# Patient Record
Sex: Male | Born: 1990 | Race: Black or African American | Hispanic: No | Marital: Single | Smoking: Current every day smoker
Health system: Southern US, Community
[De-identification: ages and names within clinical notes are randomized; demographics above are authoritative.]

## PROBLEM LIST (undated history)

## (undated) HISTORY — PX: EYE SURGERY: SHX253

---
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
## 2012-01-08 ENCOUNTER — Ambulatory Visit: Payer: Self-pay | Admitting: Family Medicine

## 2012-01-17 ENCOUNTER — Ambulatory Visit: Payer: Self-pay | Admitting: Family Medicine

## 2012-02-12 ENCOUNTER — Ambulatory Visit: Payer: Self-pay | Admitting: Family Medicine

## 2012-05-13 ENCOUNTER — Emergency Department (INDEPENDENT_AMBULATORY_CARE_PROVIDER_SITE_OTHER)
Admission: EM | Admit: 2012-05-13 | Discharge: 2012-05-13 | Disposition: A | Discharge status: Home / Self Care | Payer: Managed Care, Other (non HMO) | Source: Home / Self Care | Attending: Family Medicine | Admitting: Family Medicine

## 2012-05-13 ENCOUNTER — Encounter (HOSPITAL_COMMUNITY): Payer: Self-pay | Admitting: *Deleted

## 2012-05-13 DIAGNOSIS — L738 Other specified follicular disorders: Secondary | ICD-10-CM

## 2012-05-13 MED ORDER — DOXYCYCLINE HYCLATE 100 MG PO CAPS: 100.0000 mg | ORAL_CAPSULE | Freq: Two times a day (BID) | ORAL | Status: DC

## 2012-05-13 NOTE — ED Provider Notes (Addendum)
History     CSN: [id number]  Arrival date & time 05/13/12  0909   First MD Initiated Contact with Patient 05/13/12 1006      Chief Complaint  Patient presents with  . Rash    (Consider location/radiation/quality/duration/timing/severity/associated sxs/prior treatment) Patient is a 21 y.o. male presenting with rash. The history is provided by the patient.  Rash  This is a new problem. The current episode started more than 1 week ago. The problem has been gradually worsening. The problem is associated with an unknown factor. There has been no fever. The rash is present on the trunk, face and neck.    History reviewed. No pertinent past medical history.  History reviewed. No pertinent past surgical history.  No family history on file.  History  Substance Use Topics  . Smoking status: Not on file  . Smokeless tobacco: Not on file  . Alcohol Use: Yes     Comment: occasonally      Review of Systems  Constitutional: Negative.   Skin: Positive for rash.    Allergies  Review of patient's allergies indicates no known allergies.  Home Medications   Current Outpatient Rx  Name  Route  Sig  Dispense  Refill  . DOXYCYCLINE HYCLATE 100 MG PO CAPS   Oral   Take 1 capsule (100 mg total) by mouth 2 (two) times daily.   20 capsule   1     BP 126/77  Pulse 60  Temp 98.4 F (36.9 C) (Oral)  Resp 20  SpO2 98%  Physical Exam  Nursing note and vitals reviewed. Constitutional: He is oriented to person, place, and time. He appears well-developed and well-nourished. No distress.  HENT:  Head: Normocephalic.  Neck: Normal range of motion. Neck supple.  Lymphadenopathy:    He has no cervical adenopathy.  Neurological: He is alert and oriented to person, place, and time.  Skin: Skin is warm and dry. Rash noted.       (pustular follicular rash on axilla , upper back and neck and facial hair distribution.    ED Course  Procedures (including critical care time)  Labs  Reviewed - No data to display No results found.   1. Bacterial folliculitis       MDM          Linna Hoff, MD 05/13/12 1029  Linna Hoff, MD 05/13/12 1745

## 2012-05-13 NOTE — ED Notes (Signed)
Pt  Has  A  Rash  -  He  Reports    He  Has  Had  The  Rash  For  About 1  Week   -  He  Reports  Son  Had  A  Rash  And  Was  tx  For   ringworm   He  denys  Any  Known    Causative   Agent       He  Is  In no  Acute  Distress

## 2013-02-25 ENCOUNTER — Emergency Department (HOSPITAL_COMMUNITY)
Admission: EM | Admit: 2013-02-25 | Discharge: 2013-02-25 | Disposition: A | Discharge status: Home / Self Care | Payer: Managed Care, Other (non HMO) | Attending: Emergency Medicine | Admitting: Emergency Medicine

## 2013-02-25 ENCOUNTER — Emergency Department (HOSPITAL_COMMUNITY): Payer: Managed Care, Other (non HMO)

## 2013-02-25 ENCOUNTER — Encounter (HOSPITAL_COMMUNITY): Payer: Self-pay

## 2013-02-25 DIAGNOSIS — Z79899 Other long term (current) drug therapy: Secondary | ICD-10-CM | POA: Insufficient documentation

## 2013-02-25 DIAGNOSIS — S060X9A Concussion with loss of consciousness of unspecified duration, initial encounter: Secondary | ICD-10-CM | POA: Insufficient documentation

## 2013-02-25 DIAGNOSIS — S0181XA Laceration without foreign body of other part of head, initial encounter: Secondary | ICD-10-CM

## 2013-02-25 DIAGNOSIS — S0180XA Unspecified open wound of other part of head, initial encounter: Secondary | ICD-10-CM | POA: Insufficient documentation

## 2013-02-25 DIAGNOSIS — R112 Nausea with vomiting, unspecified: Secondary | ICD-10-CM | POA: Insufficient documentation

## 2013-02-25 DIAGNOSIS — Z87891 Personal history of nicotine dependence: Secondary | ICD-10-CM | POA: Insufficient documentation

## 2013-02-25 MED ORDER — ONDANSETRON 4 MG PO TBDP
4.0000 mg | ORAL_TABLET | Freq: Once | ORAL | Status: AC
  Administered 2013-02-25: 4 mg via ORAL
  Filled 2013-02-25: qty 1

## 2013-02-25 MED ORDER — HYDROCODONE-ACETAMINOPHEN 5-325 MG PO TABS: 1.0000 | ORAL_TABLET | Freq: Four times a day (QID) | ORAL | Status: DC | PRN

## 2013-02-25 MED ORDER — HYDROCODONE-ACETAMINOPHEN 5-325 MG PO TABS
1.0000 | ORAL_TABLET | ORAL | Status: AC
  Administered 2013-02-25: 1 via ORAL
  Filled 2013-02-25: qty 1

## 2013-02-25 MED ORDER — ONDANSETRON 8 MG PO TBDP: 8.0000 mg | ORAL_TABLET | Freq: Three times a day (TID) | ORAL | Status: DC | PRN
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
## 2013-02-25 NOTE — ED Notes (Signed)
Suture cart at bedside 
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
## 2013-02-25 NOTE — ED Provider Notes (Signed)
LACERATION REPAIR Performed by: Lottie Mussel Authorized by: Jaynie Crumble A Consent: Verbal consent obtained. Risks and benefits: risks, benefits and alternatives were discussed Consent given by: patient Patient identity confirmed: provided demographic data Prepped and Draped in normal sterile fashion Wound explored  Laceration Location: right eye  Laceration Length: 2cm  No Foreign Bodies seen or palpated  Anesthesia: local infiltration  Local anesthetic: lidocaine 1% w epinephrine  Anesthetic total: 2 ml  Irrigation method: syringe Amount of cleaning: standard  Skin closure: prolene 6.0  Number of sutures: 5  Technique: simple interrupted  Patient tolerance: Patient tolerated the procedure well with no immediate complications.   Lottie Mussel, PA-C 02/25/13 1958

## 2015-03-28 ENCOUNTER — Ambulatory Visit (INDEPENDENT_AMBULATORY_CARE_PROVIDER_SITE_OTHER): Payer: Managed Care, Other (non HMO) | Admitting: Family Medicine

## 2015-03-28 VITALS — BP 117/75 | HR 65 | Temp 98.5°F | Resp 18 | Ht 74.0 in | Wt 157.6 lb

## 2015-03-28 DIAGNOSIS — G510 Bell's palsy: Secondary | ICD-10-CM | POA: Diagnosis not present

## 2015-03-28 DIAGNOSIS — Z7952 Long term (current) use of systemic steroids: Secondary | ICD-10-CM

## 2015-03-28 LAB — GLUCOSE, POCT (MANUAL RESULT ENTRY): POC GLUCOSE: 97 mg/dL (ref 70–99)

## 2015-03-28 MED ORDER — PREDNISONE 20 MG PO TABS: ORAL_TABLET | ORAL | Status: DC

## 2015-03-28 MED ORDER — VALACYCLOVIR HCL 1 G PO TABS: 1000.0000 mg | ORAL_TABLET | Freq: Two times a day (BID) | ORAL | Status: AC

## 2015-03-28 NOTE — Patient Instructions (Addendum)
Take prednisone 3 tabs a day for 7 days. Then taper -- 2 tabs for 3 days, 1 tab for 3 days, then stop. Take valtrex twice a day for 7 days. Buy artificial tears and use frequently during the day. Wear eye patch at night. Return in 3-4 weeks for f/u  Bell Palsy Bell palsy is a condition in which the muscles on one side of the face become paralyzed. This often causes one side of the face to droop. It is a common condition and most people recover completely. RISK FACTORS Risk factors for Bell palsy include:  Pregnancy.  Diabetes.  An infection by a virus, such as infections that cause cold sores. CAUSES  Bell palsy is caused by damage to or inflammation of a nerve in your face. It is unclear why this happens, but an infection by a virus may lead to it. Most of the time the reason it happens is unknown. SIGNS AND SYMPTOMS  Symptoms can range from mild to severe and can take place over a number of hours. Symptoms may include:  Being unable to:  Raise one or both eyebrows.  Close one or both eyes.  Feel parts of your face (facial numbness).  Drooping of the eyelid and corner of the mouth.  Weakness in the face.  Paralysis of half your face.  Loss of taste.  Sensitivity to loud noises.  Difficulty chewing.  Tearing up of the affected eye.  Dryness in the affected eye.  Drooling.  Pain behind one ear. DIAGNOSIS  Diagnosis of Bell palsy may include:  A medical history and physical exam.  An MRI.  A CT scan.  Electromyography (EMG). This is a test that checks how your nerves are working. TREATMENT  Treatment may include antiviral medicine to help shorten the length of the condition. Sometimes treatment is not needed and the symptoms go away on their own. HOME CARE INSTRUCTIONS   Take medicines only as directed by your health care provider.  Do facial massages and exercises as directed by your health care provider.  If your eye is affected:  Use moisturizing  eye drops to prevent drying of your eye as directed by your health care provider.  Protect your eye as directed by your health care provider. SEEK MEDICAL CARE IF:  Your symptoms do not get better or get worse.  You are drooling.  Your eye is red, irritated, or hurts. SEEK IMMEDIATE MEDICAL CARE IF:   Another part of your body feels weak or numb.  You have difficulty swallowing.  You have a fever along with symptoms of Bell palsy.  You develop neck pain. MAKE SURE YOU:   Understand these instructions.  Will watch your condition.  Will get help right away if you are not doing well or get worse.   This information is not intended to replace advice given to you by your health care provider. Make sure you discuss any questions you have with your health care provider.   Document Released: 05/28/2005 Document Revised: 02/16/2015 Document Reviewed: 09/04/2013 Elsevier Interactive Patient Education [employer]2016 Elsevier Inc.

## 2015-03-28 NOTE — Progress Notes (Signed)
[facility] [address], [place] [address] [phone]  Date:  03/28/2015   Name:  Logan Armstrong   DOB:  1991-03-29   MRN:  [id number]  PCP:  No primary care provider on file.    Chief Complaint: Has no control ove Lt side of face   History of Present Illness:  This is a 24 y.o. male who is presenting left sided face numbness x 3 days. He states at first his face just felt numb. Yesterday he became unable to move the muscles on the left side of his face. When he woke this morning he was still unable to move the left side of his face and decided to come in. He has left sided tongue numbness as well. He denies pain, decreased hearing, change in vision, fevers, numbness or weakness elsewhere. This has never happened before.  Review of Systems:  Review of Systems See HPI  There are no active problems to display for this patient.   Prior to Admission medications   Not on File    No Known Allergies  Past Surgical History  Procedure Laterality Date  . Eye surgery      Social History  Substance Use Topics  . Smoking status: Former Games developermoker  . Smokeless tobacco: Never Used  . Alcohol Use: Yes     Comment: occasonally    Family History  Problem Relation Age of Onset  . Hyperlipidemia Mother     Medication list has been reviewed and updated.  Physical Examination:  Physical Exam  Constitutional: He is oriented to person, place, and time. He appears well-developed and well-nourished. No distress.  HENT:  Head: Normocephalic and atraumatic.  Right Ear: Hearing, tympanic membrane, external ear and ear canal normal.  Left Ear: Hearing, tympanic membrane, external ear and ear canal normal.  Nose: Nose normal.  Mouth/Throat: Uvula is midline, oropharynx is clear and moist and mucous membranes are normal.  Eyes: Conjunctivae, EOM and lids are normal. Pupils are equal, round, and reactive to light. Right eye exhibits no discharge. Left eye  exhibits no discharge. No scleral icterus.  Cardiovascular: Normal rate, regular rhythm, normal heart sounds and normal pulses.   No murmur heard. Pulmonary/Chest: Effort normal and breath sounds normal. No respiratory distress. He has no wheezes. He has no rhonchi. He has no rales.  Musculoskeletal: Normal range of motion.  Lymphadenopathy:       Head (right side): No submental, no submandibular and no tonsillar adenopathy present.       Head (left side): No submental, no submandibular and no tonsillar adenopathy present.    He has no cervical adenopathy.  Neurological: He is alert and oriented to person, place, and time. He has normal reflexes. A cranial nerve deficit (CN VII) is present. No sensory deficit. Gait normal.  Left facial paralysis. Loss of forehead lines on left. Unable to lift left eyebrow or smile with left side of mouth. He is able to partially close left eyelid when right eyelid is closed. He is unable to close left eyelid when right eyelid is open.  Neuro exam otherwise normal. No sensory deficit.  Skin: Skin is warm, dry and intact. No lesion and no rash noted.  Psychiatric: He has a normal mood and affect. His speech is normal and behavior is normal. Thought content normal.   BP 117/75 mmHg  Pulse 65  Temp(Src) 98.5 F (36.9 C) (Oral)  Resp 18  Ht 6\' 2"  (1.88 m)  Wt  157 lb 9.6 oz (71.487 kg)  BMI 20.23 kg/m2  SpO2 98%  Results for orders placed or performed in visit on 03/28/15  POCT glucose (manual entry)  Result Value Ref Range   POC Glucose 97 70 - 99 mg/dl   Assessment and Plan:  1. Bell's palsy 2. On prednisone therapy Pt will take prednisone 60 mg QAM x 7 days, 40 mg x 3 days, 20 mg x 3 days and then stop. He will also take valtrex 1 gm BID x 7 days. He will apply artificial tears every 1-2 hours during the day and wear an eye patch at night. Return in 3-4 weeks for follow up. - predniSONE (DELTASONE) 20 MG tablet; Take 3 tabs po QAM x 7 days, then 2  tabs po QAM x 3 days, then 1 tab po QAM x 3 days, then stop.  Dispense: 31 tablet; Refill: 0 - valACYclovir (VALTREX) 1000 MG tablet; Take 1 tablet (1,000 mg total) by mouth 2 (two) times daily.  Dispense: 14 tablet; Refill: 0 - POCT glucose (manual entry)   [place] [employer]. Dyke Brackett, MHS [facility]  03/28/2015

## 2015-04-25 ENCOUNTER — Ambulatory Visit: Payer: Managed Care, Other (non HMO) | Admitting: Physician Assistant

## 2015-12-14 ENCOUNTER — Emergency Department (HOSPITAL_COMMUNITY)
Admission: EM | Admit: 2015-12-14 | Discharge: 2015-12-14 | Disposition: A | Discharge status: Home / Self Care | Payer: Managed Care, Other (non HMO) | Attending: Emergency Medicine | Admitting: Emergency Medicine

## 2015-12-14 ENCOUNTER — Emergency Department (HOSPITAL_COMMUNITY): Payer: Managed Care, Other (non HMO)

## 2015-12-14 ENCOUNTER — Encounter (HOSPITAL_COMMUNITY): Payer: Self-pay | Admitting: *Deleted

## 2015-12-14 DIAGNOSIS — S51811A Laceration without foreign body of right forearm, initial encounter: Secondary | ICD-10-CM | POA: Insufficient documentation

## 2015-12-14 DIAGNOSIS — S61511A Laceration without foreign body of right wrist, initial encounter: Secondary | ICD-10-CM | POA: Insufficient documentation

## 2015-12-14 DIAGNOSIS — T22291A Burn of second degree of multiple sites of right shoulder and upper limb, except wrist and hand, initial encounter: Secondary | ICD-10-CM | POA: Insufficient documentation

## 2015-12-14 DIAGNOSIS — Y9384 Activity, sleeping: Secondary | ICD-10-CM | POA: Insufficient documentation

## 2015-12-14 DIAGNOSIS — X021XXA Exposure to smoke in controlled fire in building or structure, initial encounter: Secondary | ICD-10-CM | POA: Diagnosis not present

## 2015-12-14 DIAGNOSIS — S46901A Unspecified injury of unspecified muscle, fascia and tendon at shoulder and upper arm level, right arm, initial encounter: Secondary | ICD-10-CM

## 2015-12-14 DIAGNOSIS — Y92009 Unspecified place in unspecified non-institutional (private) residence as the place of occurrence of the external cause: Secondary | ICD-10-CM | POA: Diagnosis not present

## 2015-12-14 DIAGNOSIS — T1490XA Injury, unspecified, initial encounter: Secondary | ICD-10-CM

## 2015-12-14 DIAGNOSIS — F17200 Nicotine dependence, unspecified, uncomplicated: Secondary | ICD-10-CM | POA: Insufficient documentation

## 2015-12-14 DIAGNOSIS — Y999 Unspecified external cause status: Secondary | ICD-10-CM | POA: Diagnosis not present

## 2015-12-14 DIAGNOSIS — S60511A Abrasion of right hand, initial encounter: Secondary | ICD-10-CM | POA: Insufficient documentation

## 2015-12-14 DIAGNOSIS — T07XXXA Unspecified multiple injuries, initial encounter: Secondary | ICD-10-CM

## 2015-12-14 DIAGNOSIS — T22091A Burn of unspecified degree of multiple sites of right shoulder and upper limb, except wrist and hand, initial encounter: Secondary | ICD-10-CM | POA: Diagnosis present

## 2015-12-14 DIAGNOSIS — T2220XA Burn of second degree of shoulder and upper limb, except wrist and hand, unspecified site, initial encounter: Secondary | ICD-10-CM

## 2015-12-14 LAB — CARBOXYHEMOGLOBIN
Carboxyhemoglobin: 7.9 % (ref 0.5–1.5)
Carboxyhemoglobin: 1.9 % — ABNORMAL HIGH (ref 0.5–1.5)
Methemoglobin: 0.9 % (ref 0.0–1.5)
Methemoglobin: 1.1 % (ref 0.0–1.5)
O2 SAT: 87.9 %
O2 SAT: 97.1 %
TOTAL HEMOGLOBIN: 13.3 g/dL — AB (ref 13.5–18.0)
TOTAL HEMOGLOBIN: 12.5 g/dL — AB (ref 13.5–18.0)

## 2015-12-14 LAB — CBC WITH DIFFERENTIAL/PLATELET
Basophils Absolute: 0 10*3/uL (ref 0.0–0.1)
Basophils Relative: 1 %
EOS ABS: 0.4 10*3/uL (ref 0.0–0.7)
EOS PCT: 5 %
HCT: 39.8 % (ref 39.0–52.0)
Hemoglobin: 13.1 g/dL (ref 13.0–17.0)
LYMPHS ABS: 3 10*3/uL (ref 0.7–4.0)
LYMPHS PCT: 34 %
MCH: 28.5 pg (ref 26.0–34.0)
MCHC: 32.9 g/dL (ref 30.0–36.0)
MCV: 86.7 fL (ref 78.0–100.0)
MONO ABS: 0.5 10*3/uL (ref 0.1–1.0)
MONOS PCT: 6 %
Neutro Abs: 4.9 10*3/uL (ref 1.7–7.7)
Neutrophils Relative %: 54 %
PLATELETS: 290 10*3/uL (ref 150–400)
RBC: 4.59 MIL/uL (ref 4.22–5.81)
RDW: 13.1 % (ref 11.5–15.5)
WBC: 8.9 10*3/uL (ref 4.0–10.5)

## 2015-12-14 LAB — I-STAT VENOUS BLOOD GAS, ED
ACID-BASE DEFICIT: 1 mmol/L (ref 0.0–2.0)
Bicarbonate: 23.2 mEq/L (ref 20.0–24.0)
O2 SAT: 88 %
PCO2 VEN: 36.6 mmHg — AB (ref 45.0–50.0)
PH VEN: 7.411 — AB (ref 7.250–7.300)
Patient temperature: 98.6
TCO2: 24 mmol/L (ref 0–100)
pO2, Ven: 54 mmHg — ABNORMAL HIGH (ref 31.0–45.0)

## 2015-12-14 LAB — COMPREHENSIVE METABOLIC PANEL
ALT: 21 U/L (ref 17–63)
ANION GAP: 12 (ref 5–15)
AST: 25 U/L (ref 15–41)
Albumin: 4.2 g/dL (ref 3.5–5.0)
Alkaline Phosphatase: 66 U/L (ref 38–126)
BUN: 17 mg/dL (ref 6–20)
CALCIUM: 9.2 mg/dL (ref 8.9–10.3)
CHLORIDE: 106 mmol/L (ref 101–111)
CO2: 21 mmol/L — ABNORMAL LOW (ref 22–32)
CREATININE: 1.19 mg/dL (ref 0.61–1.24)
Glucose, Bld: 95 mg/dL (ref 65–99)
Potassium: 4.1 mmol/L (ref 3.5–5.1)
SODIUM: 139 mmol/L (ref 135–145)
Total Bilirubin: 0.6 mg/dL (ref 0.3–1.2)
Total Protein: 7.4 g/dL (ref 6.5–8.1)

## 2015-12-14 LAB — ETHANOL: ALCOHOL ETHYL (B): 140 mg/dL — AB (ref <5)

## 2015-12-14 MED ORDER — TETANUS-DIPHTH-ACELL PERTUSSIS 5-2.5-18.5 LF-MCG/0.5 IM SUSP
0.5000 mL | Freq: Once | INTRAMUSCULAR | Status: AC
  Administered 2015-12-14: 0.5 mL via INTRAMUSCULAR
  Filled 2015-12-14: qty 0.5

## 2015-12-14 MED ORDER — ONDANSETRON HCL 4 MG/2ML IJ SOLN
4.0000 mg | Freq: Once | INTRAMUSCULAR | Status: AC
Start: 2015-12-14 — End: 2015-12-14
  Administered 2015-12-14: 4 mg via INTRAVENOUS
  Filled 2015-12-14: qty 2

## 2015-12-14 MED ORDER — HYDROCODONE-ACETAMINOPHEN 5-325 MG PO TABS: 1.0000 | ORAL_TABLET | Freq: Four times a day (QID) | ORAL | Status: DC | PRN

## 2015-12-14 MED ORDER — CEPHALEXIN 500 MG PO CAPS: 500.0000 mg | ORAL_CAPSULE | Freq: Four times a day (QID) | ORAL | Status: DC

## 2015-12-14 MED ORDER — HYDROMORPHONE HCL 1 MG/ML IJ SOLN
1.0000 mg | Freq: Once | INTRAMUSCULAR | Status: AC
  Administered 2015-12-14: 1 mg via INTRAVENOUS
  Filled 2015-12-14: qty 1

## 2015-12-14 MED ORDER — CEFAZOLIN SODIUM-DEXTROSE 2-4 GM/100ML-% IV SOLN
2.0000 g | Freq: Once | INTRAVENOUS | Status: AC
  Administered 2015-12-14: 2 g via INTRAVENOUS
  Filled 2015-12-14: qty 100

## 2015-12-14 MED ORDER — LIDOCAINE-EPINEPHRINE (PF) 2 %-1:200000 IJ SOLN
20.0000 mL | Freq: Once | INTRAMUSCULAR | Status: AC
  Administered 2015-12-14: 20 mL via INTRADERMAL
  Filled 2015-12-14: qty 20

## 2015-12-14 MED ORDER — SILVER SULFADIAZINE 1 % EX CREA
TOPICAL_CREAM | Freq: Once | CUTANEOUS | Status: AC
  Administered 2015-12-14: 05:00:00 via TOPICAL
  Filled 2015-12-14: qty 85

## 2015-12-14 MED ORDER — SODIUM CHLORIDE 0.9 % IV BOLUS (SEPSIS)
1000.0000 mL | Freq: Once | INTRAVENOUS | Status: AC
  Administered 2015-12-14: 1000 mL via INTRAVENOUS

## 2015-12-14 NOTE — ED Notes (Signed)
The pt voided after he was discharged   So no urine was sent

## 2015-12-14 NOTE — ED Notes (Signed)
Rt hand wound cleaned with soap and water.  Dr ward wrapped his rt forearm with kling
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
## 2015-12-14 NOTE — ED Notes (Addendum)
Non-rebreather mask at 15 liters for an elevated carboxyhemoglobin
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
## 2015-12-14 NOTE — ED Notes (Signed)
The pt is sleeping his bp has decreased

## 2015-12-14 NOTE — ED Notes (Signed)
Pt coughing almost continuously

## 2015-12-14 NOTE — ED Notes (Signed)
The pt returned from xray 

## 2015-12-14 NOTE — ED Notes (Signed)
Dr Elesa MassedWard and Thayer Ohmhris aware of carboxyhgb 7.8
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
## 2015-12-14 NOTE — ED Notes (Signed)
The chaplain has called his mother in [place]
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
## 2015-12-14 NOTE — ED Notes (Signed)
The ortho tech placed a splint and an arm sling  Pt ambulated in the hallway  sats stayed at 98%  Pulse 95 walked to the br ok.  Drinking water stayed down

## 2015-12-14 NOTE — ED Notes (Signed)
Portable chest x-ray

## 2015-12-14 NOTE — ED Notes (Signed)
To x-ray

## 2015-12-14 NOTE — ED Notes (Signed)
The pts coughing is less still on the nonrebreather  His pain is a little better  Most of his pain is in his rt arm

## 2016-09-10 ENCOUNTER — Emergency Department (HOSPITAL_COMMUNITY): Payer: Managed Care, Other (non HMO)

## 2016-09-10 ENCOUNTER — Encounter (HOSPITAL_COMMUNITY): Payer: Self-pay

## 2016-09-10 ENCOUNTER — Emergency Department (HOSPITAL_COMMUNITY)
Admission: EM | Admit: 2016-09-10 | Discharge: 2016-09-11 | Disposition: A | Discharge status: Home / Self Care | Payer: Managed Care, Other (non HMO) | Attending: Emergency Medicine | Admitting: Emergency Medicine

## 2016-09-10 DIAGNOSIS — J111 Influenza due to unidentified influenza virus with other respiratory manifestations: Secondary | ICD-10-CM | POA: Insufficient documentation

## 2016-09-10 DIAGNOSIS — R69 Illness, unspecified: Secondary | ICD-10-CM

## 2016-09-10 DIAGNOSIS — R079 Chest pain, unspecified: Secondary | ICD-10-CM | POA: Diagnosis present

## 2016-09-10 DIAGNOSIS — R0789 Other chest pain: Secondary | ICD-10-CM | POA: Diagnosis not present

## 2016-09-10 DIAGNOSIS — F17200 Nicotine dependence, unspecified, uncomplicated: Secondary | ICD-10-CM | POA: Diagnosis not present

## 2016-09-10 LAB — BASIC METABOLIC PANEL
ANION GAP: 13 (ref 5–15)
BUN: 6 mg/dL (ref 6–20)
CALCIUM: 9 mg/dL (ref 8.9–10.3)
CO2: 25 mmol/L (ref 22–32)
CREATININE: 1.19 mg/dL (ref 0.61–1.24)
Chloride: 96 mmol/L — ABNORMAL LOW (ref 101–111)
GFR calc non Af Amer: >60 mL/min (ref >60)
Glucose, Bld: 93 mg/dL (ref 65–99)
Potassium: 3.2 mmol/L — ABNORMAL LOW (ref 3.5–5.1)
SODIUM: 134 mmol/L — AB (ref 135–145)

## 2016-09-10 LAB — CBC
HCT: 40.9 % (ref 39.0–52.0)
HEMOGLOBIN: 13.9 g/dL (ref 13.0–17.0)
MCH: 28.4 pg (ref 26.0–34.0)
MCHC: 34 g/dL (ref 30.0–36.0)
MCV: 83.6 fL (ref 78.0–100.0)
PLATELETS: 226 10*3/uL (ref 150–400)
RBC: 4.89 MIL/uL (ref 4.22–5.81)
RDW: 12.5 % (ref 11.5–15.5)
WBC: 4.8 10*3/uL (ref 4.0–10.5)

## 2016-09-10 LAB — I-STAT TROPONIN, ED: TROPONIN I, POC: 0 ng/mL (ref 0.00–0.08)

## 2016-09-10 NOTE — ED Triage Notes (Signed)
Pt states he has had cough and chest pain that began Thursday. Pt sates he feels as though someone is stepping on his chest. He reports pain is present all the time even when not coughing.

## 2016-09-10 NOTE — ED Provider Notes (Signed)
By signing my name below, I, Bridgette Habermann, attest that this documentation has been prepared under the direction and in the presence of Kristen N Ward, DO. Electronically Signed: Bridgette Habermann, ED Scribe. 09/11/16. 12:04 AM.  TIME SEEN: 11:59 PM  CHIEF COMPLAINT:  Chief Complaint  Patient presents with  . Chest Pain   HPI:  HPI Comments: Logan Armstrong is a 26 y.o. male with no pertinent PMHx, who presents to the Emergency Department complaining of waxing and waning chest pain beginning 5 days ago. Pt states pain is sharp in quality On the left side of his chest without radiation. He also notes that it feels like someone is sitting on his chest. He also has associated productive cough and shortness of breath secondary to pain. He states he's had a fever the past several days (Tmax 103.6) that has since resolved. Last fever over 24 hours ago.  Pain is exacerbated with deep breathing, palpation. He has taken Tylenol PM, Robitussin and Delsym with mild relief to his symptoms. No history of PE, DVT, exogenous estrogen use, recent fractures, surgery, trauma, hospitalization or prolonged travel. No lower extremity swelling or pain. No calf tenderness.  Pt has no other complaints at this time.  ROS: See HPI Constitutional:  fever  Eyes: no drainage  ENT: no runny nose   Cardiovascular: chest pain  Resp: SOB  GI: no vomiting GU: no dysuria Integumentary: no rash  Allergy: no hives  Musculoskeletal: no leg swelling  Neurological: no slurred speech ROS otherwise negative  PAST MEDICAL HISTORY/PAST SURGICAL HISTORY:  History reviewed. No pertinent past medical history.  MEDICATIONS:  Prior to Admission medications   Medication Sig Start Date End Date Taking? Authorizing Provider  HYDROcodone-acetaminophen (NORCO/VICODIN) 5-325 MG tablet Take 1-2 tablets by mouth every 6 (six) hours as needed. Patient not taking: Reported on 09/10/2016 12/14/15   Layla Maw Ward, DO    ALLERGIES:  No Known  Allergies  SOCIAL HISTORY:  Social History  Substance Use Topics  . Smoking status: Current Every Day Smoker  . Smokeless tobacco: Never Used  . Alcohol use Yes     Comment: occasonally    FAMILY HISTORY: Family History  Problem Relation Age of Onset  . Hyperlipidemia Mother     EXAM: BP 139/83   Pulse (!) 101   Temp 98.9 F (37.2 C) (Oral)   Resp 18   SpO2 97%  CONSTITUTIONAL: Alert and oriented and responds appropriately to questions. Well-appearing; well-nourished HEAD: Normocephalic EYES: Conjunctivae clear, pupils appear equal, EOMI ENT: normal nose; moist mucous membranes NECK: Supple, no meningismus, no nuchal rigidity, no LAD  CARD: RRR; S1 and S2 appreciated; no murmurs, no clicks, no rubs, no gallops CHEST:  Chest wall is tender to palpation over the mid left chest wall which reproduces his pain.  No crepitus, ecchymosis, erythema, warmth, rash or other lesions present.   RESP: Normal chest excursion without splinting or tachypnea; breath sounds clear and equal bilaterally; no wheezes, no rhonchi, no rales, no hypoxia or respiratory distress, speaking full sentences ABD/GI: Normal bowel sounds; non-distended; soft, non-tender, no rebound, no guarding, no peritoneal signs, no hepatosplenomegaly BACK:  The back appears normal and is non-tender to palpation, there is no CVA tenderness EXT: Normal ROM in all joints; non-tender to palpation; no edema; normal capillary refill; no cyanosis, no calf tenderness or swelling    SKIN: Normal color for age and race; warm; no rash NEURO: Moves all extremities equally PSYCH: The patient's mood and manner are  appropriate. Grooming and personal hygiene are appropriate.   EKG Interpretation  Date/Time:  Monday September 10 2016 17:12:37 EDT Ventricular Rate:  93 PR Interval:  110 QRS Duration: 84 QT Interval:  352 QTC Calculation: 437 R Axis:   77 Text Interpretation:  Sinus rhythm with short PR Septal infarct , age undetermined  Abnormal ECG No old tracing to compare Confirmed by WARD,  DO, KRISTEN ([id number]) on 09/10/2016 11:33:50 PM       MEDICAL DECISION MAKING: Patient here with flulike symptoms. He reports last fever was yesterday. Afebrile here. Has not had any antipyretics today. Chest x-ray clear. Lungs clear with normal aeration and no hypoxia, increased work of breathing or respiratory distress. I suspect that this is chest wall pain as it is reproducible with palpation. Troponin ordered in triage is negative. Doubt dissection. No risk factors for pulmonary embolus. EKG is unremarkable for ischemic abnormality, arrhythmia. He does have a slightly short PR interval but no delta wave. Given negative troponin doubt myocarditis. It is not positional therefore I doubt pericarditis. I feel he is safe to be discharged him an alternate Tylenol and Motrin. He is outside treatment window for Tamiflu. I do not think this is pneumonia. He does not need antibiotics at this time. We'll give outpatient PCP follow-up. Instructed him to alternate Tylenol and Motrin at home for pain.    At this time, I do not feel there is any life-threatening condition present. I have reviewed and discussed all results (EKG, imaging, lab, urine as appropriate) and exam findings with patient/family. I have reviewed nursing notes and appropriate previous records.  I feel the patient is safe to be discharged home without further emergent workup and can continue workup as an outpatient as needed. Discussed usual and customary return precautions. Patient/family verbalize understanding and are comfortable with this plan.  Outpatient follow-up has been provided if needed. All questions have been answered.   I personally performed the services described in this documentation, which was scribed in my presence. The recorded information has been reviewed and is accurate.      Layla Maw Ward, DO 09/11/16 [phone]

## 2016-09-11 MED ORDER — KETOROLAC TROMETHAMINE 30 MG/ML IJ SOLN
60.0000 mg | Freq: Once | INTRAMUSCULAR | Status: AC
  Administered 2016-09-11: 60 mg via INTRAMUSCULAR
  Filled 2016-09-11 (×2): qty 2

## 2016-09-11 NOTE — ED Notes (Signed)
Pt stable, ambulatory, states understanding of discharge instructions 

## 2016-09-11 NOTE — Discharge Instructions (Signed)
You may alternate Tylenol 1000 mg every 6 hours as needed for fever and pain and ibuprofen 800 mg every 8 hours as needed for fever and pain. Please rest and drink plenty of fluids. This is a viral illness causing your symptoms. You do not need antibiotics for a virus. You may use over-the-counter nasal saline spray and Afrin nasal saline spray as needed for nasal congestion. Please do not use Afrin for more than 3 days in a row. You may use Mucinex as needed for cough.  This may take 7-14 days to run its course.  We do not test for the flu from the emergency department as we do not have rapid flu swabs and it takes hours for this test to come back and it would not change our management. The flu is treated like any other virus with supportive measures as listed above. At this time you are outside the treatment window for Tamiflu. Tamiflu has to be taken within the first 48 hours of symptoms.

## 2018-01-31 ENCOUNTER — Inpatient Hospital Stay (HOSPITAL_COMMUNITY): Payer: No Typology Code available for payment source

## 2018-01-31 ENCOUNTER — Inpatient Hospital Stay (HOSPITAL_COMMUNITY)
Admission: EM | Admit: 2018-01-31 | Discharge: 2018-02-06 | DRG: 516 | Disposition: A | Discharge status: Home Health | Payer: No Typology Code available for payment source | Attending: Student | Admitting: Student

## 2018-01-31 ENCOUNTER — Encounter (HOSPITAL_COMMUNITY): Payer: Self-pay

## 2018-01-31 ENCOUNTER — Emergency Department (HOSPITAL_COMMUNITY): Payer: No Typology Code available for payment source

## 2018-01-31 DIAGNOSIS — Z8349 Family history of other endocrine, nutritional and metabolic diseases: Secondary | ICD-10-CM

## 2018-01-31 DIAGNOSIS — D62 Acute posthemorrhagic anemia: Secondary | ICD-10-CM | POA: Diagnosis not present

## 2018-01-31 DIAGNOSIS — S0081XA Abrasion of other part of head, initial encounter: Secondary | ICD-10-CM | POA: Diagnosis present

## 2018-01-31 DIAGNOSIS — S32409A Unspecified fracture of unspecified acetabulum, initial encounter for closed fracture: Secondary | ICD-10-CM

## 2018-01-31 DIAGNOSIS — F17210 Nicotine dependence, cigarettes, uncomplicated: Secondary | ICD-10-CM | POA: Diagnosis present

## 2018-01-31 DIAGNOSIS — S72001A Fracture of unspecified part of neck of right femur, initial encounter for closed fracture: Secondary | ICD-10-CM

## 2018-01-31 DIAGNOSIS — Z419 Encounter for procedure for purposes other than remedying health state, unspecified: Secondary | ICD-10-CM

## 2018-01-31 DIAGNOSIS — S32421A Displaced fracture of posterior wall of right acetabulum, initial encounter for closed fracture: Secondary | ICD-10-CM | POA: Diagnosis present

## 2018-01-31 DIAGNOSIS — M25551 Pain in right hip: Secondary | ICD-10-CM | POA: Diagnosis present

## 2018-01-31 DIAGNOSIS — S32441A Displaced fracture of posterior column [ilioischial] of right acetabulum, initial encounter for closed fracture: Secondary | ICD-10-CM | POA: Diagnosis present

## 2018-01-31 DIAGNOSIS — K5900 Constipation, unspecified: Secondary | ICD-10-CM | POA: Diagnosis not present

## 2018-01-31 LAB — BASIC METABOLIC PANEL
Anion gap: 13 (ref 5–15)
BUN: 12 mg/dL (ref 6–20)
CO2: 19 mmol/L — ABNORMAL LOW (ref 22–32)
Calcium: 9.1 mg/dL (ref 8.9–10.3)
Chloride: 108 mmol/L (ref 98–111)
Creatinine, Ser: 1 mg/dL (ref 0.61–1.24)
GFR calc Af Amer: >60 mL/min (ref >60)
GFR calc non Af Amer: >60 mL/min (ref >60)
Glucose, Bld: 90 mg/dL (ref 70–99)
Potassium: 4.3 mmol/L (ref 3.5–5.1)
Sodium: 140 mmol/L (ref 135–145)

## 2018-01-31 LAB — CBC WITH DIFFERENTIAL/PLATELET
Abs Immature Granulocytes: 0.1 10*3/uL (ref 0.0–0.1)
Basophils Absolute: 0.1 10*3/uL (ref 0.0–0.1)
Basophils Relative: 0 %
Eosinophils Absolute: 0.1 10*3/uL (ref 0.0–0.7)
Eosinophils Relative: 1 %
HCT: 40.5 % (ref 39.0–52.0)
Hemoglobin: 12.9 g/dL — ABNORMAL LOW (ref 13.0–17.0)
Immature Granulocytes: 1 %
Lymphocytes Relative: 9 %
Lymphs Abs: 1.5 10*3/uL (ref 0.7–4.0)
MCH: 28.4 pg (ref 26.0–34.0)
MCHC: 31.9 g/dL (ref 30.0–36.0)
MCV: 89 fL (ref 78.0–100.0)
Monocytes Absolute: 1.1 10*3/uL — ABNORMAL HIGH (ref 0.1–1.0)
Monocytes Relative: 6 %
Neutro Abs: 14 10*3/uL — ABNORMAL HIGH (ref 1.7–7.7)
Neutrophils Relative %: 83 %
Platelets: 270 10*3/uL (ref 150–400)
RBC: 4.55 MIL/uL (ref 4.22–5.81)
RDW: 13.2 % (ref 11.5–15.5)
WBC: 16.8 10*3/uL — ABNORMAL HIGH (ref 4.0–10.5)

## 2018-01-31 LAB — ETHANOL: Alcohol, Ethyl (B): 107 mg/dL — ABNORMAL HIGH (ref <10)

## 2018-01-31 MED ORDER — METHOCARBAMOL 1000 MG/10ML IJ SOLN
500.0000 mg | Freq: Four times a day (QID) | INTRAVENOUS | Status: DC | PRN
  Filled 2018-01-31: qty 5

## 2018-01-31 MED ORDER — MORPHINE SULFATE (PF) 2 MG/ML IV SOLN
2.0000 mg | INTRAVENOUS | Status: DC | PRN
  Administered 2018-02-03 (×2): 2 mg via INTRAVENOUS
  Filled 2018-01-31 (×3): qty 1

## 2018-01-31 MED ORDER — IOPAMIDOL (ISOVUE-300) INJECTION 61%
INTRAVENOUS | Status: AC
  Filled 2018-01-31: qty 100

## 2018-01-31 MED ORDER — MORPHINE SULFATE (PF) 4 MG/ML IV SOLN
6.0000 mg | Freq: Once | INTRAVENOUS | Status: AC
Start: 2018-01-31 — End: 2018-01-31
  Administered 2018-01-31: 6 mg via INTRAVENOUS
  Filled 2018-01-31: qty 2

## 2018-01-31 MED ORDER — ENOXAPARIN SODIUM 40 MG/0.4ML ~~LOC~~ SOLN
40.0000 mg | SUBCUTANEOUS | Status: AC
  Administered 2018-01-31 – 2018-02-01 (×2): 40 mg via SUBCUTANEOUS
  Filled 2018-01-31 (×2): qty 0.4

## 2018-01-31 MED ORDER — ETOMIDATE 2 MG/ML IV SOLN
INTRAVENOUS | Status: AC | PRN
  Administered 2018-01-31: 4 mg via INTRAVENOUS
  Administered 2018-01-31: 7 mg via INTRAVENOUS
  Administered 2018-01-31: 4 mg via INTRAVENOUS

## 2018-01-31 MED ORDER — ACETAMINOPHEN 650 MG RE SUPP: 650.0000 mg | Freq: Four times a day (QID) | RECTAL | Status: DC | PRN

## 2018-01-31 MED ORDER — TETANUS-DIPHTH-ACELL PERTUSSIS 5-2.5-18.5 LF-MCG/0.5 IM SUSP
0.5000 mL | Freq: Once | INTRAMUSCULAR | Status: AC
  Administered 2018-01-31: 0.5 mL via INTRAMUSCULAR
  Filled 2018-01-31: qty 0.5

## 2018-01-31 MED ORDER — ONDANSETRON HCL 4 MG PO TABS: 4.0000 mg | ORAL_TABLET | Freq: Four times a day (QID) | ORAL | Status: DC | PRN

## 2018-01-31 MED ORDER — OXYCODONE HCL 5 MG PO TABS
5.0000 mg | ORAL_TABLET | ORAL | Status: DC | PRN
  Administered 2018-02-01: 10 mg via ORAL
  Administered 2018-02-01 (×2): 5 mg via ORAL
  Administered 2018-02-01 – 2018-02-06 (×13): 10 mg via ORAL
  Filled 2018-01-31 (×17): qty 2
  Filled 2018-01-31: qty 1

## 2018-01-31 MED ORDER — ACETAMINOPHEN 325 MG PO TABS
650.0000 mg | ORAL_TABLET | Freq: Four times a day (QID) | ORAL | Status: DC | PRN
  Administered 2018-02-05: 650 mg via ORAL
  Filled 2018-01-31: qty 2

## 2018-01-31 MED ORDER — DIPHENHYDRAMINE HCL 12.5 MG/5ML PO ELIX: 12.5000 mg | ORAL_SOLUTION | ORAL | Status: DC | PRN

## 2018-01-31 MED ORDER — METHOCARBAMOL 500 MG PO TABS
500.0000 mg | ORAL_TABLET | Freq: Four times a day (QID) | ORAL | Status: DC | PRN
  Administered 2018-02-01 – 2018-02-06 (×8): 500 mg via ORAL
  Filled 2018-01-31 (×8): qty 1

## 2018-01-31 MED ORDER — ETOMIDATE 2 MG/ML IV SOLN
7.0000 mg | Freq: Once | INTRAVENOUS | Status: DC
  Filled 2018-01-31: qty 10

## 2018-01-31 MED ORDER — ONDANSETRON HCL 4 MG/2ML IJ SOLN: 4.0000 mg | Freq: Four times a day (QID) | INTRAMUSCULAR | Status: DC | PRN

## 2018-01-31 MED ORDER — SENNA 8.6 MG PO TABS
1.0000 | ORAL_TABLET | Freq: Two times a day (BID) | ORAL | Status: DC
  Administered 2018-01-31 – 2018-02-06 (×10): 8.6 mg via ORAL
  Filled 2018-01-31 (×10): qty 1

## 2018-01-31 MED ORDER — LIDOCAINE-EPINEPHRINE (PF) 2 %-1:200000 IJ SOLN
10.0000 mL | Freq: Once | INTRAMUSCULAR | Status: DC
  Filled 2018-01-31: qty 20

## 2018-01-31 MED ORDER — LIDOCAINE HCL (PF) 1 % IJ SOLN
INTRAMUSCULAR | Status: AC
  Filled 2018-01-31: qty 30

## 2018-01-31 MED ORDER — IOPAMIDOL (ISOVUE-300) INJECTION 61%
100.0000 mL | Freq: Once | INTRAVENOUS | Status: AC | PRN
  Administered 2018-01-31: 100 mL via INTRAVENOUS

## 2018-01-31 MED ORDER — KETOROLAC TROMETHAMINE 15 MG/ML IJ SOLN
15.0000 mg | Freq: Four times a day (QID) | INTRAMUSCULAR | Status: AC
  Administered 2018-01-31 – 2018-02-05 (×17): 15 mg via INTRAVENOUS
  Filled 2018-01-31 (×17): qty 1
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
## 2018-01-31 NOTE — ED Notes (Signed)
Attempted report 
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
## 2018-01-31 NOTE — Progress Notes (Signed)
Orthopedic Tech Progress Note Patient Details:  Hazle NordmannDayquinton L Rumbaugh 1991/05/31 [id number] As ordered by Dr. Aundria Rudogers Patient ID: Hazle Nordmannayquinton L Losh, male   DOB: 1991/05/31, 27 y.o.   MRN: [id number]   Nikki DomCrawford, Naylani Bradner 01/31/2018, 6:53 PM

## 2018-01-31 NOTE — Plan of Care (Signed)
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
## 2018-01-31 NOTE — Progress Notes (Signed)
Orthopedic Tech Progress Note Patient Details:  Logan Armstrong 1990-08-29 [id number]  Musculoskeletal Traction Type of Traction: Skeletal (Balanced Suspension) Traction Location: rle Traction Weight: 30 lbs   Post Interventions Patient Tolerated: Well Instructions Provided: Care of device   Nikki DomCrawford, Marisabel Macpherson 01/31/2018, 6:52 PM
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
## 2018-02-01 ENCOUNTER — Other Ambulatory Visit: Payer: Self-pay

## 2018-02-01 LAB — HIV ANTIBODY (ROUTINE TESTING W REFLEX): HIV Screen 4th Generation wRfx: NONREACTIVE

## 2018-02-01 MED ORDER — DIAZEPAM 5 MG PO TABS
5.0000 mg | ORAL_TABLET | Freq: Four times a day (QID) | ORAL | Status: DC | PRN
  Administered 2018-02-01 – 2018-02-06 (×8): 5 mg via ORAL
  Filled 2018-02-01 (×8): qty 1

## 2018-02-01 NOTE — Progress Notes (Signed)
   Subjective:  Patient reports pain as mild to moderate.  No complaint this morning of numbness or tingling in the leg.  He is much more comfortable.  Denies nausea, vomiting, chest pain or shortness of breath.  Objective:   VITALS:   Vitals:   01/31/18 1830 01/31/18 1845 01/31/18 2000 02/01/18 0625  BP: 132/80 130/81 (!) 153/98 135/84  Pulse: (!) 59 65 70 (!) 51  Resp: 17  20 18   Temp:   99.3 F (37.4 C) 98.3 F (36.8 C)  TempSrc:   Oral Oral  SpO2: 99% 98% 100% 94%    Neurologically intact Neurovascular intact Sensation intact distally Intact pulses distally Dorsiflexion/Plantar flexion intact Compartment soft   Lab Results  Component Value Date   WBC 16.8 (H) 01/31/2018   HGB 12.9 (L) 01/31/2018   HCT 40.5 01/31/2018   MCV 89.0 01/31/2018   PLT 270 01/31/2018   BMET    Component Value Date/Time   NA 140 01/31/2018 1545   K 4.3 01/31/2018 1545   CL 108 01/31/2018 1545   CO2 19 (L) 01/31/2018 1545   GLUCOSE 90 01/31/2018 1545   BUN 12 01/31/2018 1545   CREATININE 1.00 01/31/2018 1545   CALCIUM 9.1 01/31/2018 1545   GFRNONAA >60 01/31/2018 1545   GFRAA >60 01/31/2018 1545     Assessment/Plan:     Active Problems:   Closed posterior wall fracture of right acetabulum (HCC)   Advance diet - Bed rest until surgery Monday morning with Dr. Jena GaussHaddix  - Lovenox once daily today and tomorrow.  Will hold Monday giving impending surgery.  - We will make nothing by mouth Sunday night at midnight.   Logan KidaJason Patrick Rogers 02/01/2018, 1:09 PM   Logan RuedJason P Rogers, MD [phone]

## 2018-02-01 NOTE — Plan of Care (Signed)
  Problem: Activity: Goal: Risk for activity intolerance will decrease Outcome: Not Applicable Patient on strict bedrest.   Problem: Coping: Goal: Level of anxiety will decrease Outcome: Progressing   Problem: Pain Managment: Goal: General experience of comfort will improve Outcome: Progressing   Problem: Safety: Goal: Ability to remain free from injury will improve Outcome: Progressing   Problem: Skin Integrity: Goal: Risk for impaired skin integrity will decrease Outcome: Progressing

## 2018-02-02 ENCOUNTER — Inpatient Hospital Stay (HOSPITAL_COMMUNITY): Payer: No Typology Code available for payment source

## 2018-02-02 LAB — SURGICAL PCR SCREEN
MRSA, PCR: NEGATIVE
Staphylococcus aureus: NEGATIVE

## 2018-02-02 MED ORDER — BISACODYL 5 MG PO TBEC
5.0000 mg | DELAYED_RELEASE_TABLET | Freq: Every day | ORAL | Status: DC | PRN
  Administered 2018-02-02: 5 mg via ORAL
  Filled 2018-02-02: qty 1

## 2018-02-02 NOTE — Progress Notes (Signed)
   Subjective:  Patient reports pain as mild.  There was some bleeding noted at the medial pin site overnight that has been reinforced appropriately with a bandage.  Otherwise he denies any numbness or tingling in the foot.  He is complaining of some constipation.  We did also add some Valium for some muscle spasms as well as anxiety over the day yesterday which she states is helped.  Objective:   VITALS:   Vitals:   02/01/18 2033 02/01/18 2353 02/02/18 0404 02/02/18 0809  BP: 135/74 127/71 120/72 114/75  Pulse: 65 (!) 56 63 72  Resp: 16 20 18 18   Temp: 98.6 F (37 C) 98.8 F (37.1 C) 98.7 F (37.1 C) 98.3 F (36.8 C)  TempSrc: Oral Oral Oral Oral  SpO2: 100% 100% 100%   Weight:      Height:        Neurologically intact Neurovascular intact Sensation intact distally Intact pulses distally Dorsiflexion/Plantar flexion intact Compartment soft   Lab Results  Component Value Date   WBC 16.8 (H) 01/31/2018   HGB 12.9 (L) 01/31/2018   HCT 40.5 01/31/2018   MCV 89.0 01/31/2018   PLT 270 01/31/2018   BMET    Component Value Date/Time   NA 140 01/31/2018 1545   K 4.3 01/31/2018 1545   CL 108 01/31/2018 1545   CO2 19 (L) 01/31/2018 1545   GLUCOSE 90 01/31/2018 1545   BUN 12 01/31/2018 1545   CREATININE 1.00 01/31/2018 1545   CALCIUM 9.1 01/31/2018 1545   GFRNONAA >60 01/31/2018 1545   GFRAA >60 01/31/2018 1545     Assessment/Plan:     Active Problems:   Closed posterior wall fracture of right acetabulum (HCC)   Advance diet - Bed rest until surgery Monday morning with Dr. Jena GaussHaddix  -Lovenox dose yesterday was the final one before surgery.  He has SCDs on for DVT prophylaxis.  - We will make nothing by mouth Sunday night at midnight.  -Dulcolax added for constipation.   Logan KidaJason Patrick Armstrong 02/02/2018, 9:49 AM   Logan RuedJason P Rogers, MD [phone]

## 2018-02-02 NOTE — Progress Notes (Signed)
Bleeding noted at one pin site, starting last night. Dressing is saturated this morning, pressure dressing applied.

## 2018-02-03 ENCOUNTER — Inpatient Hospital Stay (HOSPITAL_COMMUNITY): Payer: No Typology Code available for payment source

## 2018-02-03 ENCOUNTER — Inpatient Hospital Stay (HOSPITAL_COMMUNITY): Payer: No Typology Code available for payment source | Admitting: Certified Registered"

## 2018-02-03 ENCOUNTER — Encounter (HOSPITAL_COMMUNITY): Payer: Self-pay | Admitting: Orthopedic Surgery

## 2018-02-03 ENCOUNTER — Encounter (HOSPITAL_COMMUNITY)
Admission: EM | Disposition: A | Discharge status: Home Health | Payer: Self-pay | Source: Home / Self Care | Attending: Orthopedic Surgery

## 2018-02-03 HISTORY — PX: ORIF ACETABULAR FRACTURE: SHX5029

## 2018-02-03 SURGERY — OPEN REDUCTION INTERNAL FIXATION (ORIF) ACETABULAR FRACTURE
Anesthesia: General | Site: Pelvis | Laterality: Right

## 2018-02-03 MED ORDER — CEFAZOLIN SODIUM-DEXTROSE 2-4 GM/100ML-% IV SOLN
2.0000 g | Freq: Three times a day (TID) | INTRAVENOUS | Status: AC
  Administered 2018-02-03 – 2018-02-04 (×3): 2 g via INTRAVENOUS
  Filled 2018-02-03 (×4): qty 100

## 2018-02-03 MED ORDER — PHENYLEPHRINE 40 MCG/ML (10ML) SYRINGE FOR IV PUSH (FOR BLOOD PRESSURE SUPPORT)
PREFILLED_SYRINGE | INTRAVENOUS | Status: DC | PRN
  Administered 2018-02-03: 80 ug via INTRAVENOUS

## 2018-02-03 MED ORDER — SUFENTANIL CITRATE 50 MCG/ML IV SOLN
INTRAVENOUS | Status: DC | PRN
  Administered 2018-02-03: 5 ug via INTRAVENOUS
  Administered 2018-02-03 (×2): 10 ug via INTRAVENOUS
  Administered 2018-02-03: 20 ug via INTRAVENOUS
  Administered 2018-02-03 (×3): 10 ug via INTRAVENOUS
  Administered 2018-02-03: 5 ug via INTRAVENOUS

## 2018-02-03 MED ORDER — LIDOCAINE 2% (20 MG/ML) 5 ML SYRINGE
INTRAMUSCULAR | Status: AC
  Filled 2018-02-03: qty 5

## 2018-02-03 MED ORDER — ONDANSETRON HCL 4 MG/2ML IJ SOLN
INTRAMUSCULAR | Status: DC | PRN
  Administered 2018-02-03: 4 mg via INTRAVENOUS

## 2018-02-03 MED ORDER — CEFAZOLIN SODIUM-DEXTROSE 2-4 GM/100ML-% IV SOLN
2.0000 g | INTRAVENOUS | Status: AC
  Administered 2018-02-03: 2 g via INTRAVENOUS
  Filled 2018-02-03: qty 100

## 2018-02-03 MED ORDER — SUFENTANIL CITRATE 50 MCG/ML IV SOLN
INTRAVENOUS | Status: AC
  Filled 2018-02-03: qty 1

## 2018-02-03 MED ORDER — ALBUMIN HUMAN 5 % IV SOLN
INTRAVENOUS | Status: DC | PRN
  Administered 2018-02-03: 15:00:00 via INTRAVENOUS

## 2018-02-03 MED ORDER — MEPERIDINE HCL 50 MG/ML IJ SOLN: 6.2500 mg | INTRAMUSCULAR | Status: DC | PRN

## 2018-02-03 MED ORDER — LACTATED RINGERS IV SOLN
INTRAVENOUS | Status: DC
  Administered 2018-02-03 – 2018-02-05 (×3): via INTRAVENOUS

## 2018-02-03 MED ORDER — SUGAMMADEX SODIUM 200 MG/2ML IV SOLN
INTRAVENOUS | Status: DC | PRN
  Administered 2018-02-03: 200 mg via INTRAVENOUS

## 2018-02-03 MED ORDER — ONDANSETRON HCL 4 MG/2ML IJ SOLN
INTRAMUSCULAR | Status: AC
  Filled 2018-02-03: qty 2

## 2018-02-03 MED ORDER — TOBRAMYCIN SULFATE 1.2 G IJ SOLR
INTRAMUSCULAR | Status: DC | PRN
  Administered 2018-02-03: 1.2 g via TOPICAL

## 2018-02-03 MED ORDER — CHLORHEXIDINE GLUCONATE 4 % EX LIQD: 60.0000 mL | Freq: Once | CUTANEOUS | Status: DC

## 2018-02-03 MED ORDER — POVIDONE-IODINE 10 % EX SWAB: 2.0000 "application " | Freq: Once | CUTANEOUS | Status: DC

## 2018-02-03 MED ORDER — ROCURONIUM BROMIDE 50 MG/5ML IV SOSY
PREFILLED_SYRINGE | INTRAVENOUS | Status: AC
  Filled 2018-02-03: qty 5

## 2018-02-03 MED ORDER — VANCOMYCIN HCL 1000 MG IV SOLR
INTRAVENOUS | Status: AC
  Filled 2018-02-03: qty 1000

## 2018-02-03 MED ORDER — DEXAMETHASONE SODIUM PHOSPHATE 10 MG/ML IJ SOLN
INTRAMUSCULAR | Status: DC | PRN
  Administered 2018-02-03: 4 mg via INTRAVENOUS

## 2018-02-03 MED ORDER — PROPOFOL 10 MG/ML IV BOLUS
INTRAVENOUS | Status: DC | PRN
  Administered 2018-02-03: 200 mg via INTRAVENOUS

## 2018-02-03 MED ORDER — PROMETHAZINE HCL 25 MG/ML IJ SOLN: 6.2500 mg | INTRAMUSCULAR | Status: DC | PRN

## 2018-02-03 MED ORDER — PROPOFOL 10 MG/ML IV BOLUS
INTRAVENOUS | Status: AC
  Filled 2018-02-03: qty 20

## 2018-02-03 MED ORDER — HYDROMORPHONE HCL 1 MG/ML IJ SOLN
0.2500 mg | INTRAMUSCULAR | Status: DC | PRN
  Administered 2018-02-03 (×4): 0.5 mg via INTRAVENOUS

## 2018-02-03 MED ORDER — SODIUM CHLORIDE 0.9 % IR SOLN
Status: DC | PRN
  Administered 2018-02-03: 3000 mL

## 2018-02-03 MED ORDER — LIDOCAINE 2% (20 MG/ML) 5 ML SYRINGE
INTRAMUSCULAR | Status: DC | PRN
  Administered 2018-02-03: 50 mg via INTRAVENOUS

## 2018-02-03 MED ORDER — LACTATED RINGERS IV SOLN: INTRAVENOUS | Status: DC

## 2018-02-03 MED ORDER — DEXAMETHASONE SODIUM PHOSPHATE 10 MG/ML IJ SOLN
INTRAMUSCULAR | Status: AC
  Filled 2018-02-03: qty 1

## 2018-02-03 MED ORDER — 0.9 % SODIUM CHLORIDE (POUR BTL) OPTIME
TOPICAL | Status: DC | PRN
  Administered 2018-02-03: 1000 mL

## 2018-02-03 MED ORDER — MIDAZOLAM HCL 5 MG/5ML IJ SOLN
INTRAMUSCULAR | Status: DC | PRN
  Administered 2018-02-03: 2 mg via INTRAVENOUS

## 2018-02-03 MED ORDER — MIDAZOLAM HCL 2 MG/2ML IJ SOLN
INTRAMUSCULAR | Status: AC
  Filled 2018-02-03: qty 2

## 2018-02-03 MED ORDER — VANCOMYCIN HCL 1000 MG IV SOLR
INTRAVENOUS | Status: DC | PRN
  Administered 2018-02-03: 1000 mg via TOPICAL

## 2018-02-03 MED ORDER — HYDROMORPHONE HCL 1 MG/ML IJ SOLN
INTRAMUSCULAR | Status: AC
  Filled 2018-02-03: qty 1

## 2018-02-03 MED ORDER — TOBRAMYCIN SULFATE 1.2 G IJ SOLR
INTRAMUSCULAR | Status: AC
  Filled 2018-02-03: qty 1.2

## 2018-02-03 MED ORDER — SODIUM CHLORIDE 0.9 % IJ SOLN
INTRAMUSCULAR | Status: AC
  Filled 2018-02-03: qty 10

## 2018-02-03 MED ORDER — ROCURONIUM BROMIDE 10 MG/ML (PF) SYRINGE
PREFILLED_SYRINGE | INTRAVENOUS | Status: DC | PRN
  Administered 2018-02-03 (×3): 10 mg via INTRAVENOUS
  Administered 2018-02-03: 50 mg via INTRAVENOUS
  Administered 2018-02-03: 10 mg via INTRAVENOUS
  Administered 2018-02-03: 20 mg via INTRAVENOUS
  Administered 2018-02-03: 10 mg via INTRAVENOUS

## 2018-02-03 SURGICAL SUPPLY — 77 items
ADH SKN CLS APL DERMABOND .7 (GAUZE/BANDAGES/DRESSINGS) ×4
BIT DRILL 2.5 X LONG (BIT) ×2
BIT DRILL 2.5X300 (BIT) ×1 IMPLANT
BIT DRILL X LONG 2.5 (BIT) ×1 IMPLANT
BLADE CLIPPER SURG (BLADE) ×3 IMPLANT
BONE CANC CHIPS 20CC PCAN1/4 (Bone Implant) ×4 IMPLANT
CHIPS CANC BONE 20CC PCAN1/4 (Bone Implant) ×2 IMPLANT
CHLORAPREP W/TINT 26ML (MISCELLANEOUS) ×4 IMPLANT
DERMABOND ADVANCED (GAUZE/BANDAGES/DRESSINGS) ×4
DERMABOND ADVANCED .7 DNX12 (GAUZE/BANDAGES/DRESSINGS) ×4 IMPLANT
DRAIN CHANNEL 15F RND FF W/TCR (WOUND CARE) IMPLANT
DRAPE C-ARM 42X72 X-RAY (DRAPES) ×4 IMPLANT
DRAPE C-ARMOR (DRAPES) ×4 IMPLANT
DRAPE INCISE IOBAN 66X45 STRL (DRAPES) ×8 IMPLANT
DRAPE PROXIMA HALF (DRAPES) ×8 IMPLANT
DRAPE SURG 17X23 STRL (DRAPES) ×24 IMPLANT
DRAPE U-SHAPE 47X51 STRL (DRAPES) ×4 IMPLANT
DRAPE UNIVERSAL PACK (DRAPES) ×4 IMPLANT
DRILL BIT 2.5X300 (BIT) ×4
DRILL BIT X LONG 2.5 (BIT) ×4
DRSG AQUACEL AG ADV 3.5X 4 (GAUZE/BANDAGES/DRESSINGS) ×3 IMPLANT
DRSG AQUACEL AG ADV 3.5X10 (GAUZE/BANDAGES/DRESSINGS) ×3 IMPLANT
DRSG MEPILEX BORDER 4X4 (GAUZE/BANDAGES/DRESSINGS) ×8 IMPLANT
DRSG MEPILEX BORDER 4X8 (GAUZE/BANDAGES/DRESSINGS) ×8 IMPLANT
ELECT REM PT RETURN 9FT ADLT (ELECTROSURGICAL) ×4
ELECTRODE REM PT RTRN 9FT ADLT (ELECTROSURGICAL) ×2 IMPLANT
EVACUATOR SILICONE 100CC (DRAIN) IMPLANT
GLOVE BIO SURGEON STRL SZ7.5 (GLOVE) ×16 IMPLANT
GLOVE BIOGEL PI IND STRL 7.5 (GLOVE) ×2 IMPLANT
GLOVE BIOGEL PI INDICATOR 7.5 (GLOVE) ×2
GOWN STRL REUS W/ TWL LRG LVL3 (GOWN DISPOSABLE) ×4 IMPLANT
GOWN STRL REUS W/TWL LRG LVL3 (GOWN DISPOSABLE) ×8
GRAFT BNE CANC CHIPS 1-8 20CC (Bone Implant) IMPLANT
HANDPIECE INTERPULSE COAX TIP (DISPOSABLE) ×4
KIT BASIN OR (CUSTOM PROCEDURE TRAY) ×4 IMPLANT
KIT TURNOVER KIT B (KITS) ×4 IMPLANT
MANIFOLD NEPTUNE II (INSTRUMENTS) ×4 IMPLANT
NS IRRIG 1000ML POUR BTL (IV SOLUTION) ×8 IMPLANT
PACK TOTAL JOINT (CUSTOM PROCEDURE TRAY) ×4 IMPLANT
PAD ARMBOARD 7.5X6 YLW CONV (MISCELLANEOUS) ×8 IMPLANT
PLATE BONE 91MM 7HOLE PELVIC (Plate) ×3 IMPLANT
PLATE BONE LOCK 65MM 5 HOLE (Plate) ×3 IMPLANT
RETRIEVER SUT HEWSON (MISCELLANEOUS) ×3 IMPLANT
SCREW CORTEX 3.5 22MM (Screw) ×2 IMPLANT
SCREW CORTEX 3.5 26MM (Screw) ×2 IMPLANT
SCREW CORTEX 3.5 30MM (Screw) ×4 IMPLANT
SCREW CORTEX 3.5 34MM (Screw) ×2 IMPLANT
SCREW CORTEX 3.5 36MM (Screw) ×2 IMPLANT
SCREW CORTEX 3.5X40MM (Screw) ×3 IMPLANT
SCREW CORTEX 3.5X45MM (Screw) ×3 IMPLANT
SCREW LOCK CORT ST 3.5X22 (Screw) ×1 IMPLANT
SCREW LOCK CORT ST 3.5X26 (Screw) ×1 IMPLANT
SCREW LOCK CORT ST 3.5X30 (Screw) ×2 IMPLANT
SCREW LOCK CORT ST 3.5X34 (Screw) ×1 IMPLANT
SCREW LOCK CORT ST 3.5X36 (Screw) ×1 IMPLANT
SET HNDPC FAN SPRY TIP SCT (DISPOSABLE) ×1 IMPLANT
SPONGE LAP 18X18 X RAY DECT (DISPOSABLE) ×3 IMPLANT
STAPLER VISISTAT 35W (STAPLE) ×4 IMPLANT
SUCTION FRAZIER HANDLE 10FR (MISCELLANEOUS) ×2
SUCTION FRAZIER TIP 10 FR DISP (SUCTIONS) ×3 IMPLANT
SUCTION TUBE FRAZIER 10FR DISP (MISCELLANEOUS) ×2 IMPLANT
SUT ETHILON 2 0 FS 18 (SUTURE) ×9 IMPLANT
SUT ETHILON 3 0 PS 1 (SUTURE) ×3 IMPLANT
SUT MNCRL AB 3-0 PS2 18 (SUTURE) ×1 IMPLANT
SUT MON AB 2-0 CT1 36 (SUTURE) ×1 IMPLANT
SUT VIC AB 0 CT1 27 (SUTURE) ×12
SUT VIC AB 0 CT1 27XBRD ANBCTR (SUTURE) ×5 IMPLANT
SUT VIC AB 1 CT1 18XCR BRD 8 (SUTURE) IMPLANT
SUT VIC AB 1 CT1 27 (SUTURE) ×20
SUT VIC AB 1 CT1 27XBRD ANBCTR (SUTURE) ×5 IMPLANT
SUT VIC AB 1 CT1 8-18 (SUTURE)
SUT VIC AB 2-0 CT1 27 (SUTURE) ×8
SUT VIC AB 2-0 CT1 TAPERPNT 27 (SUTURE) ×4 IMPLANT
TOWEL OR 17X24 6PK STRL BLUE (TOWEL DISPOSABLE) ×4 IMPLANT
TOWEL OR 17X26 10 PK STRL BLUE (TOWEL DISPOSABLE) ×8 IMPLANT
TRAY FOLEY MTR SLVR 16FR STAT (SET/KITS/TRAYS/PACK) ×3 IMPLANT
WATER STERILE IRR 1000ML POUR (IV SOLUTION) ×4 IMPLANT
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
## 2018-02-03 NOTE — Transfer of Care (Signed)
Immediate Anesthesia Transfer of Care Note  Patient: Logan Armstrong  Procedure(s) Performed: OPEN REDUCTION INTERNAL FIXATION (ORIF) ACETABULAR FRACTURE (Right Pelvis)  Patient Location: PACU  Anesthesia Type:General  Level of Consciousness: awake and patient cooperative  Airway & Oxygen Therapy: Patient Spontanous Breathing and Patient connected to nasal cannula oxygen  Post-op Assessment: Report given to RN, Post -op Vital signs reviewed and stable and Patient moving all extremities  Post vital signs: Reviewed and stable  Last Vitals:  Vitals Value Taken Time  BP 169/100 02/03/2018  3:51 PM  Temp    Pulse    Resp 15 02/03/2018  3:56 PM  SpO2    Vitals shown include unvalidated device data.  Last Pain:  Vitals:   02/03/18 0549  TempSrc:   PainSc: 0-No pain         Complications: No apparent anesthesia complications

## 2018-02-03 NOTE — Plan of Care (Signed)

## 2018-02-03 NOTE — Anesthesia Preprocedure Evaluation (Addendum)
Anesthesia Evaluation  Patient identified by MRN, date of birth, ID band Patient awake    Reviewed: Allergy & Precautions, NPO status , Patient's Chart, lab work & pertinent test results  Airway Mallampati: I  TM Distance: >3 FB Neck ROM: Full    Dental  (+) Teeth Intact, Dental Advisory Given   Pulmonary Current Smoker,    breath sounds clear to auscultation       Cardiovascular negative cardio ROS   Rhythm:Regular Rate:Normal     Neuro/Psych negative neurological ROS  negative psych ROS   GI/Hepatic negative GI ROS, Neg liver ROS,   Endo/Other  negative endocrine ROS  Renal/GU negative Renal ROS     Musculoskeletal negative musculoskeletal ROS (+)   Abdominal Normal abdominal exam  (+)   Peds  Hematology negative hematology ROS (+)   Anesthesia Other Findings   Reproductive/Obstetrics                            Lab Results  Component Value Date   WBC 16.8 (H) 01/31/2018   HGB 12.9 (L) 01/31/2018   HCT 40.5 01/31/2018   MCV 89.0 01/31/2018   PLT 270 01/31/2018   Lab Results  Component Value Date   CREATININE 1.00 01/31/2018   BUN 12 01/31/2018   NA 140 01/31/2018   K 4.3 01/31/2018   CL 108 01/31/2018   CO2 19 (L) 01/31/2018   No results found for: INR, PROTIME   Anesthesia Physical Anesthesia Plan  ASA: II  Anesthesia Plan: General   Post-op Pain Management:    Induction: Intravenous  PONV Risk Score and Plan:   Airway Management Planned: Oral ETT  Additional Equipment: None  Intra-op Plan:   Post-operative Plan: Extubation in OR  Informed Consent: I have reviewed the patients History and Physical, chart, labs and discussed the procedure including the risks, benefits and alternatives for the proposed anesthesia with the patient or authorized representative who has indicated his/her understanding and acceptance.   Dental advisory given  Plan Discussed  with: CRNA  Anesthesia Plan Comments:        Anesthesia Quick Evaluation

## 2018-02-03 NOTE — Interval H&P Note (Deleted)
History and Physical Interval Note:  02/03/2018 11:44 AM  Logan Armstrong  has presented today for surgery, with the diagnosis of Right acetabular fracture dislocation  The various methods of treatment have been discussed with the patient and family. After consideration of risks, benefits and other options for treatment, the patient has consented to  Procedure(s): CLOSED REDUCTION HIP (Right) as a surgical intervention .  The patient's history has been reviewed, patient examined, no change in status, stable for surgery.  I have reviewed the patient's chart and labs.  Questions were answered to the patient's satisfaction.     Caryn BeeKevin P Sandor Arboleda

## 2018-02-03 NOTE — Interval H&P Note (Signed)
History and Physical Interval Note:  02/03/2018 11:45 AM  Nason L Davene CostainBullock  has presented today for surgery, with the diagnosis of Right acetabular fracture dislocation  The various methods of treatment have been discussed with the patient and family. After consideration of risks, benefits and other options for treatment, the patient has consented to  Open reduction internal fixation right acetabular fracture as a surgical intervention .  The patient's history has been reviewed, patient examined, no change in status, stable for surgery.  I have reviewed the patient's chart and labs.  Questions were answered to the patient's satisfaction.     Caryn BeeKevin P Kalana Yust

## 2018-02-03 NOTE — Anesthesia Procedure Notes (Signed)
Procedure Name: Intubation Date/Time: 02/03/2018 12:27 PM Performed by: Moshe Salisbury, CRNA Pre-anesthesia Checklist: Patient identified, Emergency Drugs available, Suction available and Patient being monitored Patient Re-evaluated:Patient Re-evaluated prior to induction Oxygen Delivery Method: Circle System Utilized Preoxygenation: Pre-oxygenation with 100% oxygen Induction Type: IV induction Ventilation: Mask ventilation without difficulty Laryngoscope Size: Mac and 4 Grade View: Grade I Tube type: Oral Tube size: 8.0 mm Number of attempts: 1 Airway Equipment and Method: Stylet Placement Confirmation: ETT inserted through vocal cords under direct vision,  positive ETCO2 and breath sounds checked- equal and bilateral Secured at: 23 cm Tube secured with: Tape Dental Injury: Teeth and Oropharynx as per pre-operative assessment
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
## 2018-02-03 NOTE — Progress Notes (Signed)
Patient transferred to 5N18, mom at bedside, call within reach, receiving RN at bedside, no further questions or concerns.  Hermina BartersBOWMAN, Westlee Devita M, RN
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
## 2018-02-04 ENCOUNTER — Encounter (HOSPITAL_COMMUNITY): Payer: Self-pay | Admitting: Student

## 2018-02-04 LAB — CBC
HCT: 26.6 % — ABNORMAL LOW (ref 39.0–52.0)
HEMOGLOBIN: 8.7 g/dL — AB (ref 13.0–17.0)
MCH: 28.1 pg (ref 26.0–34.0)
MCHC: 32.7 g/dL (ref 30.0–36.0)
MCV: 85.8 fL (ref 78.0–100.0)
Platelets: 234 10*3/uL (ref 150–400)
RBC: 3.1 MIL/uL — ABNORMAL LOW (ref 4.22–5.81)
RDW: 12.8 % (ref 11.5–15.5)
WBC: 8.4 10*3/uL (ref 4.0–10.5)

## 2018-02-04 MED ORDER — ENOXAPARIN SODIUM 40 MG/0.4ML ~~LOC~~ SOLN
40.0000 mg | SUBCUTANEOUS | Status: DC
  Administered 2018-02-04 – 2018-02-06 (×3): 40 mg via SUBCUTANEOUS
  Filled 2018-02-04 (×3): qty 0.4

## 2018-02-04 NOTE — Plan of Care (Signed)
  Problem: Education: Goal: Knowledge of General Education information will improve Description Including pain rating scale, medication(s)/side effects and non-pharmacologic comfort measures Outcome: Progressing Note:  POC and pain management reviewed with pt.   
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
## 2018-02-04 NOTE — Progress Notes (Signed)
Spoke with Rasheen RN at [facility] to make aware of the patients scheduled radiation treatment 02/05/2018 at 9:15 am.  I asked that [facility] transportation be set up to have the patient here at 8:45 am.  Will continue to follow as necessary.  Coralyn HellingLaToya M. Vickii ChafeSilva RN, BSN

## 2018-02-04 NOTE — Anesthesia Postprocedure Evaluation (Signed)
Anesthesia Post Note  Patient: Logan Armstrong  Procedure(s) Performed: OPEN REDUCTION INTERNAL FIXATION (ORIF) ACETABULAR FRACTURE (Right Pelvis)     Patient location during evaluation: PACU Anesthesia Type: General Level of consciousness: awake and alert Pain management: pain level controlled Vital Signs Assessment: post-procedure vital signs reviewed and stable Respiratory status: spontaneous breathing, nonlabored ventilation, respiratory function stable and patient connected to nasal cannula oxygen Cardiovascular status: blood pressure returned to baseline and stable Postop Assessment: no apparent nausea or vomiting Anesthetic complications: no    Last Vitals:  Vitals:   02/04/18 0602 02/04/18 0730  BP: 128/85 138/81  Pulse: 70 77  Resp: 18 16  Temp: 37.1 C 36.8 C  SpO2: 100% 100%    Last Pain:  Vitals:   02/04/18 0730  TempSrc: Oral  PainSc: 6                  Carisa Backhaus S
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
## 2018-02-05 ENCOUNTER — Encounter: Payer: Self-pay | Admitting: Radiation Oncology

## 2018-02-05 ENCOUNTER — Ambulatory Visit
Admit: 2018-02-05 | Discharge: 2018-02-05 | Disposition: A | Discharge status: Home / Self Care | Payer: No Typology Code available for payment source | Attending: Radiation Oncology | Admitting: Radiation Oncology

## 2018-02-05 ENCOUNTER — Ambulatory Visit
Admit: 2018-02-05 | Discharge: 2018-02-05 | Disposition: A | Discharge status: Home / Self Care | Payer: No Typology Code available for payment source | Source: Ambulatory Visit | Attending: Radiation Oncology | Admitting: Radiation Oncology

## 2018-02-05 DIAGNOSIS — S32421A Displaced fracture of posterior wall of right acetabulum, initial encounter for closed fracture: Secondary | ICD-10-CM

## 2018-02-05 NOTE — Progress Notes (Signed)
[facility] Rad[facility] Oncology Dept Therapy Treatment Record Phone [phone]   Radiation Therapy was administered to Logan Armstrong on: 02/05/2018  9:04 AM and was treatment # 1 out of a planned course of 1 treatments.  Radiation Treatment  1). Beam photons with 6-10 energy  2). Brachytherapy None  3). Stereotactic Radiosurgery None  4). Other Radiation None     Logan BallerJennifer Menno Armstrong, RT (T)

## 2018-02-05 NOTE — Progress Notes (Signed)
Pt transported via [facility] to [facility] for his radiation treatment.

## 2018-02-05 NOTE — Progress Notes (Signed)
Pt medicated for pain while on [facility] stretcher. Report given and en route to [facility] for radiation.
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
## 2018-02-05 NOTE — Progress Notes (Signed)
PT Cancellation Note  Patient Details Name: Logan Armstrong MRN: [id number] DOB: 03-19-1991   Cancelled Treatment:    Reason Eval/Treat Not Completed: Patient at procedure or test/unavailable. [facility] PT to re-attempt as time allows.   [name], [name] 02/05/2018, 10:29 AM   [name], PT  Office # [phone] Pager [phone]

## 2018-02-05 NOTE — Progress Notes (Signed)
OT Cancellation Note  Patient Details Name: Logan Armstrong MRN: [id number] DOB: 1990-09-26   Cancelled Treatment:      Reason Eval/Treat Not Completed: Attempted treatment session x2 this date however Patient is at procedure or test/unavailable off campus. [facility]. OT to re-attempt as time allows.   Charletta CousinBarnhill, Amy Beth Dixon, OTR/L 02/05/2018, 10:42 AM

## 2018-02-06 DIAGNOSIS — S72001A Fracture of unspecified part of neck of right femur, initial encounter for closed fracture: Secondary | ICD-10-CM

## 2018-02-06 MED ORDER — METHOCARBAMOL 750 MG PO TABS: 750.0000 mg | ORAL_TABLET | Freq: Four times a day (QID) | ORAL | 25 refills | Status: DC | PRN

## 2018-02-06 MED ORDER — ASPIRIN EC 325 MG PO TBEC: 325.0000 mg | DELAYED_RELEASE_TABLET | Freq: Two times a day (BID) | ORAL | 0 refills | Status: AC

## 2018-02-06 MED ORDER — OXYCODONE HCL 5 MG PO TABS: 5.0000 mg | ORAL_TABLET | ORAL | 0 refills | Status: DC | PRN
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
## 2018-02-06 NOTE — Plan of Care (Signed)
  Problem: Education: Goal: Knowledge of General Education information will improve Description: Including pain rating scale, medication(s)/side effects and non-pharmacologic comfort measures Outcome: Adequate for Discharge   Problem: Health Behavior/Discharge Planning: Goal: Ability to manage health-related needs will improve Outcome: Adequate for Discharge   Problem: Clinical Measurements: Goal: Ability to maintain clinical measurements within normal limits will improve Outcome: Adequate for Discharge Goal: Will remain free from infection Outcome: Adequate for Discharge Goal: Diagnostic test results will improve Outcome: Adequate for Discharge Goal: Respiratory complications will improve Outcome: Adequate for Discharge Goal: Cardiovascular complication will be avoided Outcome: Adequate for Discharge   Problem: Nutrition: Goal: Adequate nutrition will be maintained Outcome: Adequate for Discharge   Problem: Coping: Goal: Level of anxiety will decrease Outcome: Adequate for Discharge   Problem: Elimination: Goal: Will not experience complications related to bowel motility Outcome: Adequate for Discharge Goal: Will not experience complications related to urinary retention Outcome: Adequate for Discharge   Problem: Safety: Goal: Ability to remain free from injury will improve Outcome: Adequate for Discharge   Problem: Skin Integrity: Goal: Risk for impaired skin integrity will decrease Outcome: Adequate for Discharge   

## 2018-02-06 NOTE — Discharge Instructions (Signed)
Orthopaedic Trauma Service Discharge Instructions   General Discharge Instructions  WEIGHT BEARING STATUS: Touchdown weight bearing to right leg  RANGE OF MOTION/ACTIVITY: No restrictions for hip or knee range of motion  Wound Care: Keep dressing on until Saturday (8/31) if the incision is without drainage you may leave open to air. If not cover with gauze and tape  DVT/PE prophylaxis: Take aspirin 325 mg twice daily to prevent blood clots  Diet: as you were eating previously.  Can use over the counter stool softeners and bowel preparations, such as Miralax, to help with bowel movements.  Narcotics can be constipating.  Be sure to drink plenty of fluids  PAIN MEDICATION USE AND EXPECTATIONS  You have likely been given narcotic medications to help control your pain.  After a traumatic event that results in an fracture (broken bone) with or without surgery, it is ok to use narcotic pain medications to help control one's pain.  We understand that everyone responds to pain differently and each individual patient will be evaluated on a regular basis for the continued need for narcotic medications. Ideally, narcotic medication use should last no more than 6-8 weeks (coinciding with fracture healing).   As a patient it is your responsibility as well to monitor narcotic medication use and report the amount and frequency you use these medications when you come to your office visit.   We would also advise that if you are using narcotic medications, you should take a dose prior to therapy to maximize you participation.  IF YOU ARE ON NARCOTIC MEDICATIONS IT IS NOT PERMISSIBLE TO OPERATE A MOTOR VEHICLE (MOTORCYCLE/CAR/TRUCK/MOPED) OR HEAVY MACHINERY DO NOT MIX NARCOTICS WITH OTHER CNS (CENTRAL NERVOUS SYSTEM) DEPRESSANTS SUCH AS ALCOHOL   STOP SMOKING OR USING NICOTINE PRODUCTS!!!!  As discussed nicotine severely impairs your body's ability to heal surgical and traumatic wounds but also impairs bone  healing.  Wounds and bone heal by forming microscopic blood vessels (angiogenesis) and nicotine is a vasoconstrictor (essentially, shrinks blood vessels).  Therefore, if vasoconstriction occurs to these microscopic blood vessels they essentially disappear and are unable to deliver necessary nutrients to the healing tissue.  This is one modifiable factor that you can do to dramatically increase your chances of healing your injury.    (This means no smoking, no nicotine gum, patches, etc)  DO NOT USE NONSTEROIDAL ANTI-INFLAMMATORY DRUGS (NSAID'S)  Using products such as Advil (ibuprofen), Aleve (naproxen), Motrin (ibuprofen) for additional pain control during fracture healing can delay and/or prevent the healing response.  If you would like to take over the counter (OTC) medication, Tylenol (acetaminophen) is ok.  However, some narcotic medications that are given for pain control contain acetaminophen as well. Therefore, you should not exceed more than 4000 mg of tylenol in a day if you do not have liver disease.  Also note that there are may OTC medicines, such as cold medicines and allergy medicines that my contain tylenol as well.  If you have any questions about medications and/or interactions please ask your doctor/PA or your pharmacist.      ICE AND ELEVATE INJURED/OPERATIVE EXTREMITY  Using ice and elevating the injured extremity above your heart can help with swelling and pain control.  Icing in a pulsatile fashion, such as 20 minutes on and 20 minutes off, can be followed.    Do not place ice directly on skin. Make sure there is a barrier between to skin and the ice pack.    Using frozen items such as  frozen peas works well as the conform nicely to the are that needs to be iced.  USE AN ACE WRAP OR TED HOSE FOR SWELLING CONTROL  In addition to icing and elevation, Ace wraps or TED hose are used to help limit and resolve swelling.  It is recommended to use Ace wraps or TED hose until you are  informed to stop.    When using Ace Wraps start the wrapping distally (farthest away from the body) and wrap proximally (closer to the body)   Example: If you had surgery on your leg or thing and you do not have a splint on, start the ace wrap at the toes and work your way up to the thigh        If you had surgery on your upper extremity and do not have a splint on, start the ace wrap at your fingers and work your way up to the upper arm  CALL THE OFFICE WITH ANY QUESTIONS OR CONCERNS: [phone]
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
## 2018-02-06 NOTE — Progress Notes (Signed)
Orthopedic Tech Progress Note Patient Details:  Logan NordmannDayquinton L Thaden 08-01-1990 [id number]  Ortho Devices Type of Ortho Device: Crutches Ortho Device/Splint Interventions: Adjustment   Post Interventions Patient Tolerated: Fair Instructions Provided: Care of device   Saul FordyceJennifer C Abdias Hickam 02/06/2018, 12:38 PM
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
## 2018-02-25 NOTE — Progress Notes (Signed)
  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 This was accomplished with AP and PA fields.  Narrative: The patient tolerated radiation treatment relatively well.   No difficulties.  Plan: The patient has completed radiation treatment. The patient will return to radiation oncology clinic on an as needed basis. ________________________________  Radene GunningJohn S. Dereon Corkery, MD, PhD  This document serves as a record of services personally performed by Dorothy PufferJohn Kellin Bartling, MD. It was created on his behalf by Ivar BuryHaley Woodruff, a trained medical scribe. The creation of this record is based on the scribe's personal observations and the provider's statements to them. This document has been checked and approved by the attending provider.

## 2018-12-20 ENCOUNTER — Other Ambulatory Visit: Payer: Self-pay | Admitting: *Deleted

## 2018-12-20 DIAGNOSIS — Z20822 Contact with and (suspected) exposure to covid-19: Secondary | ICD-10-CM

## 2018-12-26 LAB — NOVEL CORONAVIRUS, NAA: SARS-CoV-2, NAA: NOT DETECTED

## 2020-05-21 ENCOUNTER — Emergency Department (HOSPITAL_COMMUNITY): Payer: 59

## 2020-05-21 ENCOUNTER — Emergency Department (HOSPITAL_COMMUNITY)
Admission: EM | Admit: 2020-05-21 | Discharge: 2020-05-21 | Disposition: A | Discharge status: Home / Self Care | Payer: 59 | Attending: Emergency Medicine | Admitting: Emergency Medicine

## 2020-05-21 ENCOUNTER — Other Ambulatory Visit: Payer: Self-pay

## 2020-05-21 DIAGNOSIS — S92241B Displaced fracture of medial cuneiform of right foot, initial encounter for open fracture: Secondary | ICD-10-CM

## 2020-05-21 DIAGNOSIS — S70312A Abrasion, left thigh, initial encounter: Secondary | ICD-10-CM | POA: Insufficient documentation

## 2020-05-21 DIAGNOSIS — S92331B Displaced fracture of third metatarsal bone, right foot, initial encounter for open fracture: Secondary | ICD-10-CM

## 2020-05-21 DIAGNOSIS — F17200 Nicotine dependence, unspecified, uncomplicated: Secondary | ICD-10-CM | POA: Insufficient documentation

## 2020-05-21 DIAGNOSIS — S92344B Nondisplaced fracture of fourth metatarsal bone, right foot, initial encounter for open fracture: Secondary | ICD-10-CM

## 2020-05-21 DIAGNOSIS — S80212A Abrasion, left knee, initial encounter: Secondary | ICD-10-CM | POA: Diagnosis not present

## 2020-05-21 DIAGNOSIS — W3400XA Accidental discharge from unspecified firearms or gun, initial encounter: Secondary | ICD-10-CM | POA: Insufficient documentation

## 2020-05-21 DIAGNOSIS — S91301A Unspecified open wound, right foot, initial encounter: Secondary | ICD-10-CM | POA: Insufficient documentation

## 2020-05-21 DIAGNOSIS — S92321B Displaced fracture of second metatarsal bone, right foot, initial encounter for open fracture: Secondary | ICD-10-CM

## 2020-05-21 DIAGNOSIS — S99921A Unspecified injury of right foot, initial encounter: Secondary | ICD-10-CM | POA: Diagnosis present

## 2020-05-21 LAB — I-STAT CHEM 8, ED
BUN: 26 mg/dL — ABNORMAL HIGH (ref 6–20)
Calcium, Ion: 1.06 mmol/L — ABNORMAL LOW (ref 1.15–1.40)
Chloride: 105 mmol/L (ref 98–111)
Creatinine, Ser: 1.5 mg/dL — ABNORMAL HIGH (ref 0.61–1.24)
Glucose, Bld: 86 mg/dL (ref 70–99)
HCT: 44 % (ref 39.0–52.0)
Hemoglobin: 15 g/dL (ref 13.0–17.0)
Potassium: 4.7 mmol/L (ref 3.5–5.1)
Sodium: 138 mmol/L (ref 135–145)
TCO2: 20 mmol/L — ABNORMAL LOW (ref 22–32)

## 2020-05-21 LAB — LACTIC ACID, PLASMA: Lactic Acid, Venous: 4.9 mmol/L (ref 0.5–1.9)

## 2020-05-21 LAB — COMPREHENSIVE METABOLIC PANEL
ALT: 21 U/L (ref 0–44)
AST: 25 U/L (ref 15–41)
Albumin: 4.4 g/dL (ref 3.5–5.0)
Alkaline Phosphatase: 70 U/L (ref 38–126)
Anion gap: 18 — ABNORMAL HIGH (ref 5–15)
BUN: 17 mg/dL (ref 6–20)
CO2: 17 mmol/L — ABNORMAL LOW (ref 22–32)
Calcium: 9.3 mg/dL (ref 8.9–10.3)
Chloride: 103 mmol/L (ref 98–111)
Creatinine, Ser: 1.43 mg/dL — ABNORMAL HIGH (ref 0.61–1.24)
GFR, Estimated: >60 mL/min (ref >60)
Glucose, Bld: 89 mg/dL (ref 70–99)
Potassium: 3.6 mmol/L (ref 3.5–5.1)
Sodium: 138 mmol/L (ref 135–145)
Total Bilirubin: 0.5 mg/dL (ref 0.3–1.2)
Total Protein: 7.5 g/dL (ref 6.5–8.1)

## 2020-05-21 LAB — CBC
HCT: 44.2 % (ref 39.0–52.0)
Hemoglobin: 14 g/dL (ref 13.0–17.0)
MCH: 27.8 pg (ref 26.0–34.0)
MCHC: 31.7 g/dL (ref 30.0–36.0)
MCV: 87.9 fL (ref 80.0–100.0)
Platelets: 303 10*3/uL (ref 150–400)
RBC: 5.03 MIL/uL (ref 4.22–5.81)
RDW: 13.3 % (ref 11.5–15.5)
WBC: 11.6 10*3/uL — ABNORMAL HIGH (ref 4.0–10.5)
nRBC: 0 % (ref 0.0–0.2)

## 2020-05-21 LAB — PROTIME-INR
INR: 1 (ref 0.8–1.2)
Prothrombin Time: 12.5 seconds (ref 11.4–15.2)

## 2020-05-21 MED ORDER — SODIUM CHLORIDE 0.9 % IV BOLUS (SEPSIS)
1000.0000 mL | Freq: Once | INTRAVENOUS | Status: AC
  Administered 2020-05-21: 04:00:00 1000 mL via INTRAVENOUS

## 2020-05-21 MED ORDER — SODIUM CHLORIDE 0.9 % IV BOLUS (SEPSIS)
1000.0000 mL | Freq: Once | INTRAVENOUS | Status: AC
  Administered 2020-05-21: 03:00:00 1000 mL via INTRAVENOUS

## 2020-05-21 MED ORDER — MORPHINE SULFATE (PF) 4 MG/ML IV SOLN
4.0000 mg | Freq: Once | INTRAVENOUS | Status: AC
Start: 2020-05-21 — End: 2020-05-21
  Administered 2020-05-21: 03:00:00 4 mg via INTRAVENOUS
  Filled 2020-05-21: qty 1

## 2020-05-21 MED ORDER — HYDROMORPHONE HCL 1 MG/ML IJ SOLN
1.0000 mg | Freq: Once | INTRAMUSCULAR | Status: AC
  Administered 2020-05-21: 1 mg via INTRAVENOUS
  Filled 2020-05-21: qty 1

## 2020-05-21 MED ORDER — IBUPROFEN 800 MG PO TABS: 800.0000 mg | ORAL_TABLET | Freq: Four times a day (QID) | ORAL | 0 refills | Status: DC | PRN

## 2020-05-21 MED ORDER — TRANEXAMIC ACID FOR EPISTAXIS
500.0000 mg | Freq: Once | TOPICAL | Status: AC
  Administered 2020-05-21: 03:00:00 500 mg via TOPICAL
  Filled 2020-05-21: qty 10

## 2020-05-21 MED ORDER — CEFAZOLIN SODIUM-DEXTROSE 2-4 GM/100ML-% IV SOLN
2.0000 g | Freq: Once | INTRAVENOUS | Status: AC
  Administered 2020-05-21: 2 g via INTRAVENOUS
  Filled 2020-05-21: qty 100

## 2020-05-21 MED ORDER — ONDANSETRON HCL 4 MG/2ML IJ SOLN
4.0000 mg | Freq: Once | INTRAMUSCULAR | Status: AC
  Administered 2020-05-21: 4 mg via INTRAVENOUS
  Filled 2020-05-21: qty 2

## 2020-05-21 MED ORDER — DOCUSATE SODIUM 100 MG PO CAPS: 100.0000 mg | ORAL_CAPSULE | Freq: Two times a day (BID) | ORAL | 0 refills | Status: AC

## 2020-05-21 MED ORDER — ONDANSETRON 4 MG PO TBDP: 4.0000 mg | ORAL_TABLET | Freq: Four times a day (QID) | ORAL | 0 refills | Status: DC | PRN

## 2020-05-21 MED ORDER — OXYCODONE-ACETAMINOPHEN 5-325 MG PO TABS: 2.0000 | ORAL_TABLET | Freq: Four times a day (QID) | ORAL | 0 refills | Status: DC | PRN

## 2020-05-21 NOTE — ED Notes (Signed)
Date and time results received: 05/21/20 0207 (use smartphrase ".now" to insert current time)  Test: Lacitic Acid Critical Value: 4.9  Name of Provider Notified: Ward  Orders Received? Or Actions Taken?: N/A

## 2020-05-21 NOTE — ED Notes (Signed)
Officer Neier is at bedside at this time.

## 2020-05-21 NOTE — ED Notes (Addendum)
MANUAL BP 160/80. ORAL TEMP 98.7.
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
## 2020-05-21 NOTE — Discharge Instructions (Signed)
Please keep your splint on at all times.  I recommend keeping your leg elevated above the level of your heart while at rest to help with pain, swelling.  If your pain is uncontrolled, you begin having numbness or weakness in this leg, have bleeding that is soaking through your bandages, please return to the emergency department.  We recommend close follow-up with Dr. Susa Simmonds with orthopedic surgery.  You will need surgical repair of your injuries in approximately 2 weeks.  You are being provided a prescription for opiates (also known as narcotics) for pain control.  Opiates can be addictive and should only be used when absolutely necessary for pain control when other alternatives do not work.  We recommend you only use them for the recommended amount of time and only as prescribed.  Please do not take with other sedative medications or alcohol.  Please do not drive, operate machinery, make important decisions while taking opiates.  Please note that these medications can be addictive and have high abuse potential.  Patients can become addicted to narcotics after only taking them for a few days.  Please keep these medications locked away from children, teenagers or any family members with history of substance abuse.  Narcotic pain medicine may also make you constipated.  You may use over-the-counter medications such as MiraLAX, Colace to prevent constipation.  If you become constipated you may use over-the-counter enemas as needed.  Itching and nausea are common side effects of narcotic pain medication.  If you develop uncontrolled vomiting or a rash, please stop these medications.

## 2020-05-21 NOTE — ED Notes (Signed)
Pressure dressing applied to right foot.

## 2020-05-21 NOTE — ED Notes (Signed)
Vaseline gauze applied to the left anterior knee and posterior thigh abrassions then wrapped with gauze. Patient education completed regarding dressing care.

## 2020-05-21 NOTE — Progress Notes (Signed)
Orthopedic Tech Progress Note Patient Details:  SESAR MADEWELL 1990-08-30 [id number]  Ortho Devices Type of Ortho Device: Crutches,Post (short leg) splint Ortho Device/Splint Location: RLE Ortho Device/Splint Interventions: Ordered,Application   Post Interventions Patient Tolerated: Well Instructions Provided: Care of device   Donald Pore 05/21/2020, 4:29 AM

## 2020-05-21 NOTE — ED Notes (Signed)
Pt refused covid test

## 2020-05-21 NOTE — ED Notes (Signed)
MD verified pulses via doppler to right foot. Cap refill < 3 sec.
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
## 2020-05-21 NOTE — ED Notes (Signed)
Pt transported to CT at this time.
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
## 2020-10-05 ENCOUNTER — Encounter (HOSPITAL_COMMUNITY): Payer: Self-pay

## 2020-10-05 ENCOUNTER — Ambulatory Visit (HOSPITAL_COMMUNITY)
Admission: EM | Admit: 2020-10-05 | Discharge: 2020-10-05 | Disposition: A | Discharge status: Home / Self Care | Payer: 59 | Attending: Family Medicine | Admitting: Family Medicine

## 2020-10-05 DIAGNOSIS — L0291 Cutaneous abscess, unspecified: Secondary | ICD-10-CM | POA: Insufficient documentation

## 2020-10-05 DIAGNOSIS — Z113 Encounter for screening for infections with a predominantly sexual mode of transmission: Secondary | ICD-10-CM | POA: Diagnosis present

## 2020-10-05 LAB — HIV ANTIBODY (ROUTINE TESTING W REFLEX): HIV Screen 4th Generation wRfx: NONREACTIVE

## 2020-10-05 MED ORDER — DOXYCYCLINE HYCLATE 100 MG PO CAPS: 100.0000 mg | ORAL_CAPSULE | Freq: Two times a day (BID) | ORAL | 0 refills | Status: DC

## 2020-10-05 MED ORDER — LIDOCAINE-EPINEPHRINE 1 %-1:100000 IJ SOLN
INTRAMUSCULAR | Status: AC
  Filled 2020-10-05: qty 1

## 2020-10-05 NOTE — ED Provider Notes (Signed)
[facility]   [id number] 10/05/20 Arrival Time: 1136  ASSESSMENT & PLAN:  1. Abscess   2. Screening for STDs (sexually transmitted diseases)     Incision and Drainage Procedure Note  Anesthesia: 2% plain lidocaine  Procedure Details  The procedure, risks and complications have been discussed in detail (including, but not limited to pain and bleeding) with the patient.  The skin induration was prepped and draped in the usual fashion. After adequate local anesthesia, I&D with a #11 blade was performed on the right upper chest wall with purulent drainage leaving an approx 0.5 x 1 cm wound. No signs of dead tissue.  EBL: minimal Drains: none Packing: n/a Condition: Tolerated procedure well Complications: none.  Begin: Meds ordered this encounter  Medications  . doxycycline (VIBRAMYCIN) 100 MG capsule    Sig: Take 1 capsule (100 mg total) by mouth 2 (two) times daily.    Dispense:  14 capsule    Refill:  0   Wound care instructions discussed. To return in 48 hours for wound check if needed.  Finish all antibiotics. OTC analgesics as needed.  Reviewed expectations re: course of current medical issues. Questions answered. Outlined signs and symptoms indicating need for more acute intervention. Patient verbalized understanding. After Visit Summary given.   SUBJECTIVE:  Logan Armstrong is a 30 y.o. male who presents with a possible infection of his R upper chest wall; "think a spider bit me". Area first noted approximately 2-3 days ago without active drainage and without active bleeding. Symptoms have progressed to a point and plateaued since beginning. Fever: absent. OTC/home treatment: none.  Also requests STD screening. No symptoms. Gets checked every 90 days.  OBJECTIVE:  Vitals:   10/05/20 1306  BP: (!) 151/98  Pulse: 70  Resp: 18  Temp: 98.2 F (36.8 C)  TempSrc: Oral  SpO2: 99%     General appearance: alert; no distress R upper chest  wall: approx 1x1.5 cm induration with overlying crusted skin; tender to touch; no active drainage or bleeding; surrounding erythema Psychological: alert and cooperative; normal mood and affect  No Known Allergies  History reviewed. No pertinent past medical history. Social History   Socioeconomic History  . Marital status: Single    Spouse name: Not on file  . Number of children: Not on file  . Years of education: Not on file  . Highest education level: Not on file  Occupational History  . Not on file  Tobacco Use  . Smoking status: Current Every Day Smoker  . Smokeless tobacco: Never Used  Substance and Sexual Activity  . Alcohol use: Yes    Comment: occasonally  . Drug use: No  . Sexual activity: Not on file  Other Topics Concern  . Not on file  Social History Narrative   ** Merged History Encounter **       Social Determinants of Health   Financial Resource Strain: Not on file  Food Insecurity: Not on file  Transportation Needs: Not on file  Physical Activity: Not on file  Stress: Not on file  Social Connections: Not on file   Family History  Problem Relation Age of Onset  . Hyperlipidemia Mother    Past Surgical History:  Procedure Laterality Date  . EYE SURGERY    . ORIF ACETABULAR FRACTURE Right 02/03/2018   Procedure: OPEN REDUCTION INTERNAL FIXATION (ORIF) ACETABULAR FRACTURE;  Surgeon: Roby Lofts, MD;  Location: [facility] OR;  Service: Orthopedics;  Laterality: Right;  Mardella Layman, MD 10/05/20 [phone]

## 2020-10-05 NOTE — Discharge Instructions (Signed)
We have sent testing for sexually transmitted infections. We will notify you of any positive results once they are received. If required, we will prescribe any medications you might need.  Please refrain from all sexual activity for at least the next seven days.  

## 2020-10-05 NOTE — ED Triage Notes (Signed)
Pt c/o a bug bite. He states he thought it was an ingrown hair and reports he has applied warm compress. Pt states it has gotten bigger. He states he would also like and STD check.

## 2020-10-06 LAB — CYTOLOGY, (ORAL, ANAL, URETHRAL) ANCILLARY ONLY
Chlamydia: POSITIVE — AB
Comment: NEGATIVE
Comment: NEGATIVE
Comment: NORMAL
Neisseria Gonorrhea: NEGATIVE
Trichomonas: POSITIVE — AB

## 2020-10-06 LAB — RPR: RPR Ser Ql: NONREACTIVE

## 2020-10-10 ENCOUNTER — Telehealth (HOSPITAL_COMMUNITY): Payer: Self-pay | Admitting: Emergency Medicine

## 2020-10-10 MED ORDER — METRONIDAZOLE 500 MG PO TABS: 500.0000 mg | ORAL_TABLET | Freq: Two times a day (BID) | ORAL | 0 refills | Status: DC

## 2021-05-18 ENCOUNTER — Encounter (HOSPITAL_COMMUNITY): Payer: Self-pay | Admitting: Emergency Medicine

## 2021-05-18 ENCOUNTER — Other Ambulatory Visit: Payer: Self-pay

## 2021-05-18 ENCOUNTER — Ambulatory Visit (HOSPITAL_COMMUNITY)
Admission: EM | Admit: 2021-05-18 | Discharge: 2021-05-18 | Disposition: A | Discharge status: Home / Self Care | Payer: 59 | Attending: Family Medicine | Admitting: Family Medicine

## 2021-05-18 DIAGNOSIS — L299 Pruritus, unspecified: Secondary | ICD-10-CM

## 2021-05-18 DIAGNOSIS — R21 Rash and other nonspecific skin eruption: Secondary | ICD-10-CM | POA: Diagnosis not present

## 2021-05-18 MED ORDER — PREDNISONE 10 MG (48) PO TBPK: ORAL_TABLET | ORAL | 0 refills | Status: AC

## 2021-05-18 MED ORDER — PERMETHRIN 5 % EX CREA: TOPICAL_CREAM | CUTANEOUS | 1 refills | Status: AC

## 2021-05-18 NOTE — ED Triage Notes (Signed)
Pt is present today with a rash on arms , legs, back, and abdomen. Pt states sx started 12/5. Pt states he tried OTC medication and nothing has worked.

## 2021-05-18 NOTE — ED Provider Notes (Signed)
  [facility]   [id number] 05/18/21 Arrival Time: 1528  ASSESSMENT & PLAN:  1. Rash and nonspecific skin eruption   2. Itching    Unclear trigger. Discussed.  Begin trial of: Meds ordered this encounter  Medications   permethrin (ELIMITE) 5 % cream    Sig: Apply from neck down before bed then wash off in the morning. May repeat in one week.    Dispense:  60 g    Refill:  1   predniSONE (STERAPRED UNI-PAK 48 TAB) 10 MG (48) TBPK tablet    Sig: Take as directed.    Dispense:  48 tablet    Refill:  0    Will follow up with PCP or here if worsening or failing to improve as anticipated. Reviewed expectations re: course of current medical issues. Questions answered. Outlined signs and symptoms indicating need for more acute intervention. Patient verbalized understanding. After Visit Summary given.   SUBJECTIVE:  Logan Armstrong is a 30 y.o. male who presents with a skin complaint. Itchy rash; diffuse; past week. No new exposures or medications. Benadryl with some help.   OBJECTIVE: Vitals:   05/18/21 1639  BP: (!) 136/98  Pulse: (!) 113  Resp: 18  Temp: 98.9 F (37.2 C)  TempSrc: Oral  SpO2: 98%    Slight tachycardia noted.  General appearance: alert; no distress HEENT: [place]; AT Neck: supple with FROM Extremities: no edema; moves all extremities normally Skin: warm and dry; signs of infection: no; generalized maculopapular erythematous rash Psychological: alert and cooperative; normal mood and affect  No Known Allergies  History reviewed. No pertinent past medical history. Social History   Socioeconomic History   Marital status: Single    Spouse name: Not on file   Number of children: Not on file   Years of education: Not on file   Highest education level: Not on file  Occupational History   Not on file  Tobacco Use   Smoking status: Every Day   Smokeless tobacco: Never  Substance and Sexual Activity   Alcohol use: Yes    Comment:  occasonally   Drug use: No   Sexual activity: Not on file  Other Topics Concern   Not on file  Social History Narrative   ** Merged History Encounter **       Social Determinants of Health   Financial Resource Strain: Not on file  Food Insecurity: Not on file  Transportation Needs: Not on file  Physical Activity: Not on file  Stress: Not on file  Social Connections: Not on file  Intimate Partner Violence: Not on file   Family History  Problem Relation Age of Onset   Hyperlipidemia Mother    Past Surgical History:  Procedure Laterality Date   EYE SURGERY     ORIF ACETABULAR FRACTURE Right 02/03/2018   Procedure: OPEN REDUCTION INTERNAL FIXATION (ORIF) ACETABULAR FRACTURE;  Surgeon: Roby Lofts, MD;  Location: [facility] OR;  Service: Orthopedics;  Laterality: Right;      Mardella Layman, MD 05/18/21 [phone]
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
